# Patient Record
Sex: Male | Born: 1975
Health system: Southern US, Community
[De-identification: ages and names within clinical notes are randomized; demographics above are authoritative.]

## PROBLEM LIST (undated history)

## (undated) DIAGNOSIS — I10 Essential (primary) hypertension: Secondary | ICD-10-CM

## (undated) DIAGNOSIS — F32A Depression, unspecified: Secondary | ICD-10-CM

## (undated) DIAGNOSIS — S92909A Unspecified fracture of unspecified foot, initial encounter for closed fracture: Secondary | ICD-10-CM

## (undated) DIAGNOSIS — M722 Plantar fascial fibromatosis: Secondary | ICD-10-CM

## (undated) DIAGNOSIS — F329 Major depressive disorder, single episode, unspecified: Secondary | ICD-10-CM

## (undated) DIAGNOSIS — R0609 Other forms of dyspnea: Secondary | ICD-10-CM

## (undated) DIAGNOSIS — N289 Disorder of kidney and ureter, unspecified: Secondary | ICD-10-CM

## (undated) DIAGNOSIS — R0600 Dyspnea, unspecified: Secondary | ICD-10-CM

## (undated) DIAGNOSIS — F419 Anxiety disorder, unspecified: Secondary | ICD-10-CM

## (undated) DIAGNOSIS — I517 Cardiomegaly: Secondary | ICD-10-CM

## (undated) DIAGNOSIS — J189 Pneumonia, unspecified organism: Secondary | ICD-10-CM

## (undated) DIAGNOSIS — M1990 Unspecified osteoarthritis, unspecified site: Secondary | ICD-10-CM

## (undated) DIAGNOSIS — E785 Hyperlipidemia, unspecified: Secondary | ICD-10-CM

## (undated) DIAGNOSIS — Z992 Dependence on renal dialysis: Secondary | ICD-10-CM

## (undated) HISTORY — DX: Unspecified fracture of unspecified foot, initial encounter for closed fracture: S92.909A

## (undated) HISTORY — DX: Dyspnea, unspecified: R06.00

## (undated) HISTORY — PX: PYLOROPLASTY: SHX418

## (undated) HISTORY — DX: Disorder of kidney and ureter, unspecified: N28.9

## (undated) HISTORY — DX: Hyperlipidemia, unspecified: E78.5

## (undated) HISTORY — DX: Other forms of dyspnea: R06.09

## (undated) HISTORY — DX: Essential (primary) hypertension: I10

## (undated) HISTORY — PX: NO PAST SURGERIES: SHX2092

## (undated) HISTORY — PX: HERNIA REPAIR: SHX51

## (undated) HISTORY — DX: Plantar fascial fibromatosis: M72.2

## (undated) HISTORY — DX: Cardiomegaly: I51.7

---
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
## 2009-04-28 ENCOUNTER — Encounter: Payer: Self-pay | Admitting: Orthopedic Surgery

## 2009-04-28 ENCOUNTER — Emergency Department (HOSPITAL_COMMUNITY): Admission: EM | Admit: 2009-04-28 | Discharge: 2009-04-28 | Payer: Self-pay | Admitting: Emergency Medicine

## 2009-05-02 ENCOUNTER — Encounter: Payer: Self-pay | Admitting: Orthopedic Surgery

## 2009-05-03 ENCOUNTER — Ambulatory Visit: Payer: Self-pay | Admitting: Orthopedic Surgery

## 2009-05-03 DIAGNOSIS — S92909A Unspecified fracture of unspecified foot, initial encounter for closed fracture: Secondary | ICD-10-CM | POA: Insufficient documentation

## 2009-05-04 ENCOUNTER — Encounter: Payer: Self-pay | Admitting: Orthopedic Surgery

## 2009-06-01 ENCOUNTER — Encounter: Payer: Self-pay | Admitting: Cardiology

## 2009-06-15 ENCOUNTER — Ambulatory Visit: Payer: Self-pay | Admitting: Orthopedic Surgery

## 2009-06-15 DIAGNOSIS — R0989 Other specified symptoms and signs involving the circulatory and respiratory systems: Secondary | ICD-10-CM | POA: Insufficient documentation

## 2009-06-15 DIAGNOSIS — I10 Essential (primary) hypertension: Secondary | ICD-10-CM | POA: Insufficient documentation

## 2009-06-15 DIAGNOSIS — I517 Cardiomegaly: Secondary | ICD-10-CM | POA: Insufficient documentation

## 2009-06-15 DIAGNOSIS — M722 Plantar fascial fibromatosis: Secondary | ICD-10-CM | POA: Insufficient documentation

## 2009-06-15 DIAGNOSIS — R0609 Other forms of dyspnea: Secondary | ICD-10-CM | POA: Insufficient documentation

## 2009-06-15 DIAGNOSIS — R0600 Dyspnea, unspecified: Secondary | ICD-10-CM | POA: Insufficient documentation

## 2009-06-16 ENCOUNTER — Ambulatory Visit: Payer: Self-pay | Admitting: Cardiology

## 2009-06-23 ENCOUNTER — Encounter: Payer: Self-pay | Admitting: Cardiology

## 2009-06-23 ENCOUNTER — Ambulatory Visit: Payer: Self-pay | Admitting: Cardiology

## 2009-06-23 ENCOUNTER — Ambulatory Visit (HOSPITAL_COMMUNITY): Admission: RE | Admit: 2009-06-23 | Discharge: 2009-06-23 | Payer: Self-pay | Admitting: Cardiology

## 2009-07-06 ENCOUNTER — Encounter (INDEPENDENT_AMBULATORY_CARE_PROVIDER_SITE_OTHER): Payer: Self-pay | Admitting: *Deleted

## 2009-07-31 ENCOUNTER — Encounter (INDEPENDENT_AMBULATORY_CARE_PROVIDER_SITE_OTHER): Payer: Self-pay | Admitting: *Deleted

## 2009-08-22 ENCOUNTER — Ambulatory Visit: Payer: Self-pay | Admitting: Orthopedic Surgery

## 2010-04-18 ENCOUNTER — Encounter (INDEPENDENT_AMBULATORY_CARE_PROVIDER_SITE_OTHER): Payer: Self-pay | Admitting: *Deleted

## 2010-09-12 ENCOUNTER — Encounter (INDEPENDENT_AMBULATORY_CARE_PROVIDER_SITE_OTHER): Payer: Self-pay | Admitting: *Deleted

## 2010-11-21 ENCOUNTER — Emergency Department (HOSPITAL_COMMUNITY)
Admission: EM | Admit: 2010-11-21 | Discharge: 2010-11-21 | Payer: Self-pay | Source: Home / Self Care | Admitting: Emergency Medicine

## 2011-01-08 NOTE — Letter (Signed)
Summary: Appointment - Reminder 2  [facility] [place]. [address] [place] [address]   Phone: [phone]  Fax: [phone]     September 12, 2010 MRN: [id number]   Isaiah Ramos [place], [place]  [address]   Dear [name],  Our records indicate that it is time to schedule a follow-up appointment.  Dr. Verl Blalock         recommended that you follow up with [place] in    [place]        . It is very important that we reach you to schedule this appointment. We look forward to participating in your health care needs. Please contact us at the number listed above at your earliest convenience to schedule your appointment.  If you are unable to make an appointment at this time, give [place] a call so we can update our records.     Sincerely,   Public relations account executive

## 2011-01-08 NOTE — Letter (Signed)
Summary: Appointment - Reminder 2  [facility] [place]. [address] [place] [address]   Phone: [phone]  Fax: [phone]     Apr 18, 2010 MRN: [id number]   SONNIE ARRINDE[place], [place]  [address]   Dear Mr. Rettig,  Our records indicate that it is time to schedule a follow-up appointment.  Dr.  Verl Blalock        recommended that you follow up with [place] in    JANUARY 2011        . It is very important that we reach you to schedule this appointment. We look forward to participating in your health care needs. Please contact us at the number listed above at your earliest convenience to schedule your appointment.  If you are unable to make an appointment at this time, give [place] a call so we can update our records.     Sincerely,   [facility] Scheduling Team [phone]

## 2011-01-30 IMAGING — CR DG ANKLE COMPLETE 3+V*R*
3 series · 3 of 3 positions shown · non-contrast
Comparison: None

CLINICAL DATA: Injured ankle playing basketball.

RIGHT ANKLE - COMPLETE 3+ VIEW

[view not recorded (1 of 3)]
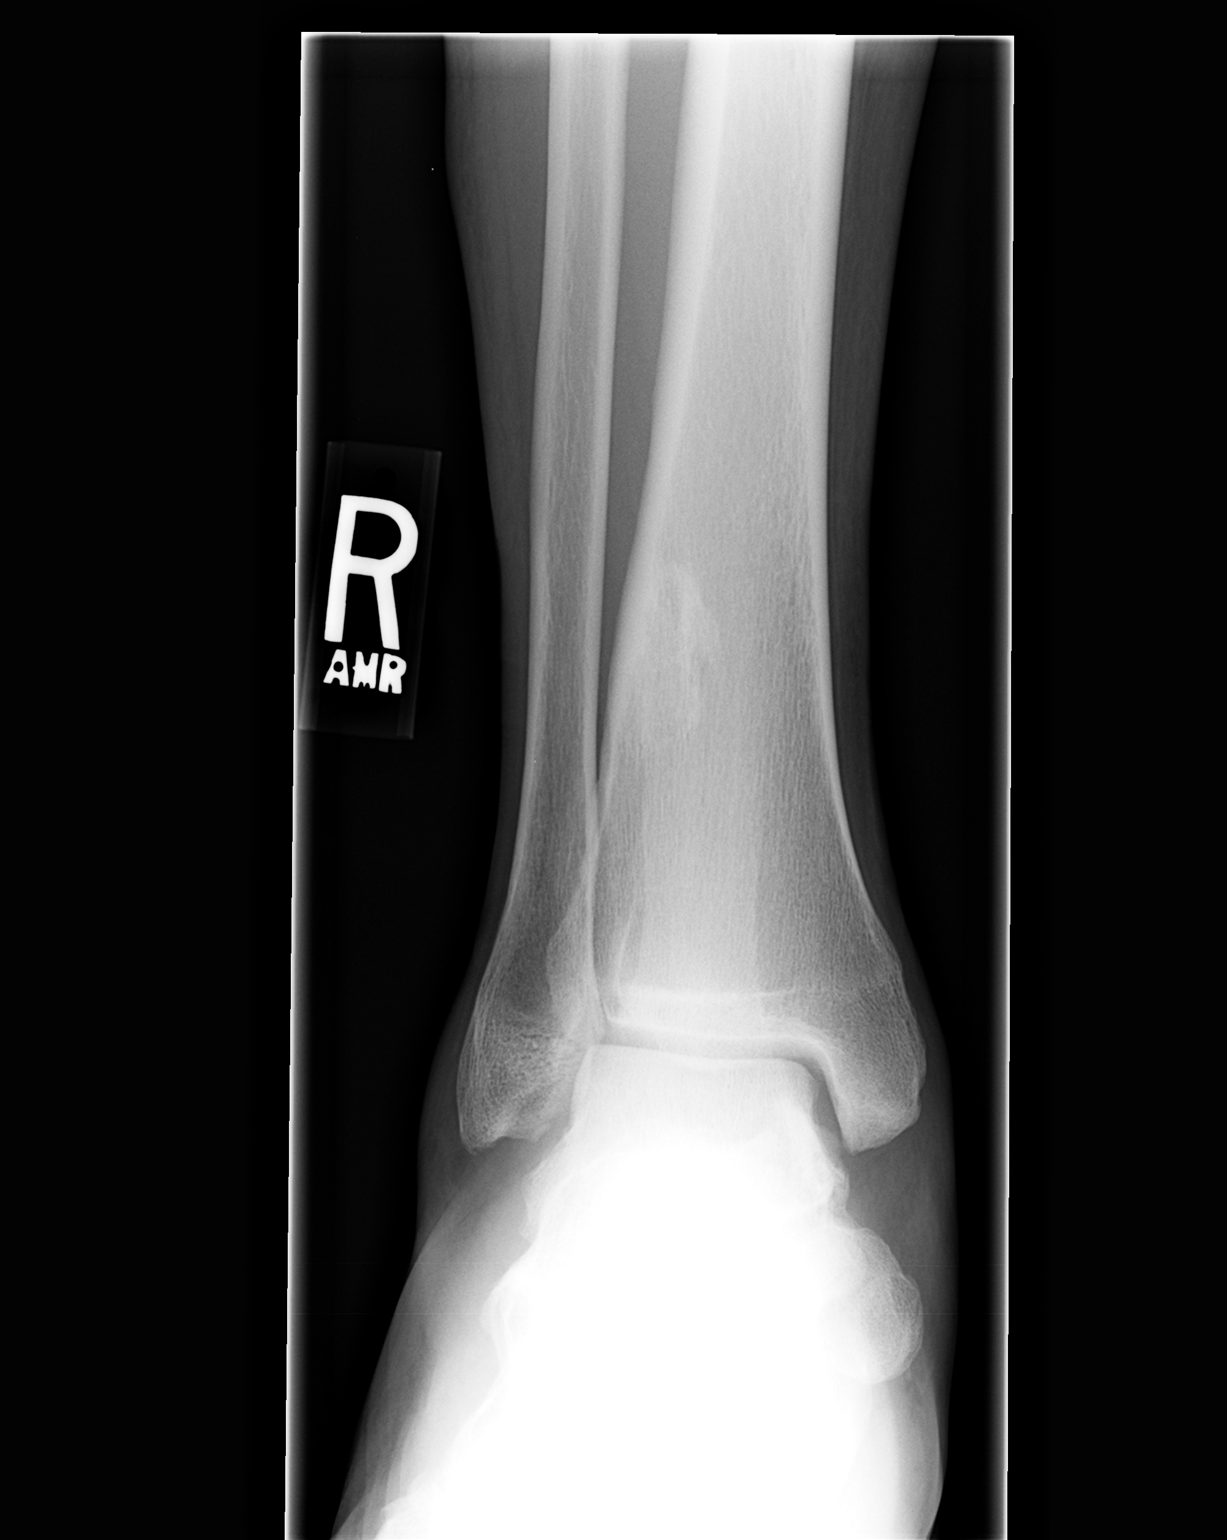

[view not recorded (2 of 3)]
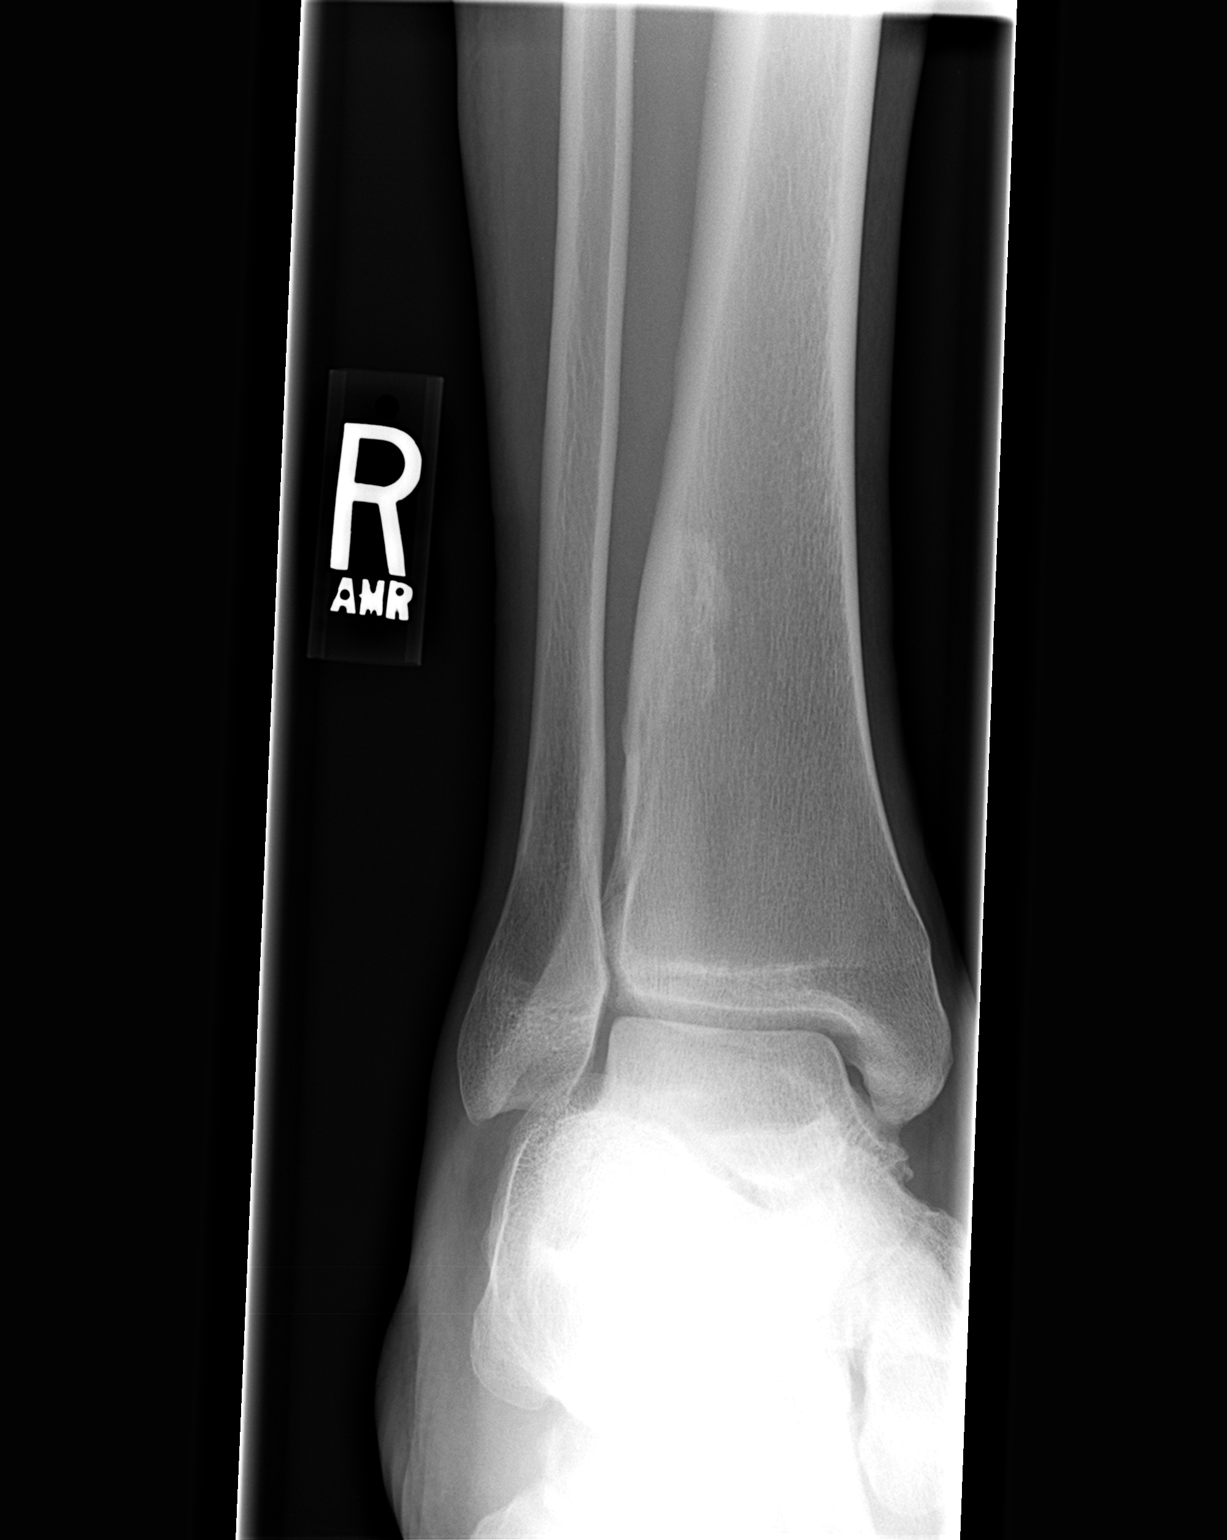

[view not recorded (3 of 3)]
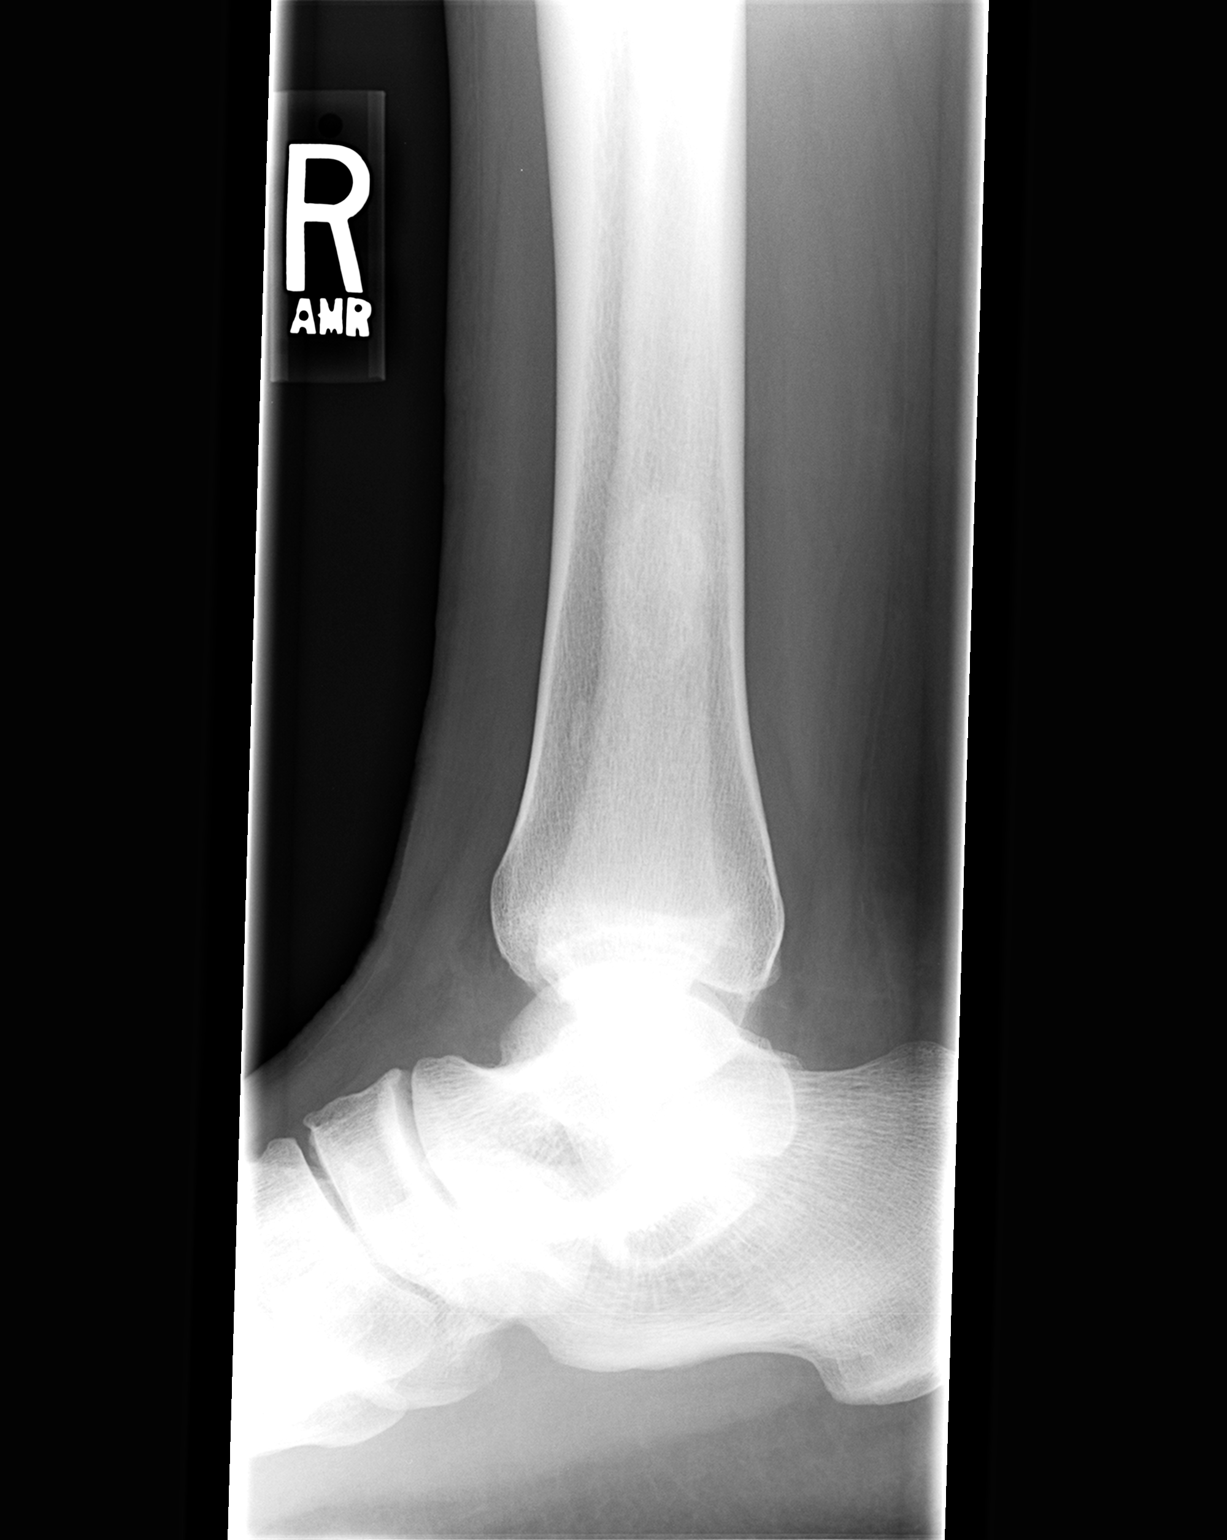

[3 of 3 positions shown; findings below may reference images not displayed]

FINDINGS: The ankle mortise is maintained.  No definite fractures
are seen.  No osteochondral lesions.  There is a healed or healing
fibrous cortical lesion involving the lateral aspect of the
metadiaphyseal region of the tibia.
IMPRESSION: No acute bony findings.

## 2011-04-23 NOTE — Assessment & Plan Note (Signed)
[facility] OFFICE NOTE   MCKINNON, GUMMO                        MRN:          [id number]  DATE:06/16/2009                            DOB:          07/23/1976    I was asked by Dr. Kerin Perna to consult on Aston Lempke with chest  discomfort and a history of cardiomegaly.   Isaiah Ramos is a 35 year old black male from [place], who has been  in this area for about 4 years.   He played a lot of basketball growing up and was told about 10 years ago  that his heart was enlarged.  At that time, he had a stress test that he  was told was negative.   He has recently been having some tingling sensations in the center of  his chest with some shooting pain down his left arm.  It is not made  worse with exertion.  It happens at rest and spontaneously.  It is not  positional related as well.   When he plays basketball, he has no significant limitations and has had  no history of exertional syncope or chest discomfort.   He denies any orthopnea, PND, or peripheral edema.   His history is significant for recent diagnosis of hypertension.  He  says his blood pressure is around 170 over about 100 when he was at the  [facility].  He was started on metoprolol tartrate 100 mg per day and  lisinopril 10 mg per day.  Laboratory data showed a creatinine of 2.22.  The rest of his labs were normal except for a low HDL of 36, LDL 72, and  a total cholesterol 140.   He is not diabetic.   PAST MEDICAL HISTORY:   ALLERGIES:  He has no known drug allergies.   MEDICATIONS:  He is currently on,  1. Metoprolol tartrate 100 mg p.o. daily.  2. Lisinopril 10 mg per day.  3. Fish oil 4 capsules a day.   He smokes about a pack of cigarettes in 2 days.  He does not drink or  use drugs.  He has had no surgeries.   SOCIAL HISTORY:  He is unemployed.  He has 4 children.  He enjoys music.  He occasionally plays basketball  still.   FAMILY HISTORY:  He has a history of diabetes and hypertension in his  father.  There is a history of one brother who died of sarcoidosis at  age 69.   REVIEW OF SYSTEMS:  He wears reading glasses.  He has occasional  dizziness, but no headaches.  He has had no syncope.  He states in his  review of systems that he occasionally gets short of breath lying down,  but I did not get that history from him.  He does have four tattoos.  The rest of review of systems are negative.   PHYSICAL EXAMINATION:  VITAL SIGNS:  He is 6 feet 5 inches, weighs 234,  his blood pressure is 106/69 in the right arm, 111/68 in the left arm,  his pulse is 58 and regular.  GENERAL:  Very pleasant, alert and oriented x3, seems to have a type B  or laid back personality.  His electrocardiogram is normal except for sinus brady and maybe some  early repol, particularly in the anterior precordial and lateral leads.  He has T-wave inversion in aVL, which is the same as it was in the [facility].  HEENT:  Otherwise unremarkable.  Dentition is satisfactory.  NECK:  Supple.  Carotid upstrokes are equal bilaterally without bruits.  There is no JVD.  Thyroid is not enlarged.  Trachea is midline.  CHEST/LUNGS:  Clear to auscultation and percussion.  HEART:  Nondisplaced PMI.  Normal S1 and S2.  No gallop.  ABDOMEN:  Soft, good bowel sounds.  No midline bruit.  No hepatomegaly.  EXTREMITIES:  No cyanosis, clubbing, or edema.  Pulses are intact.  NEUROLOGIC:  Intact.   ASSESSMENT:  1. Noncoronary chest pain.  2. Early repolarization.  3. Unknown duration of hypertension recently diagnosed at the [facility] and now on therapy with good control.  His creatinine of 2.2      attested this chronicity.  4. Tobacco use.  5. Mixed hyperlipidemia.   I had a long talk with Mr. Jude today.  I have recommend a 2D  echocardiogram to assess his LV function not to mention what degree of  LVH he might have at this  point.  Hopefully, this will regress with  hypertensive therapy as I have outlined with him today.  I have told him  to never come off his blood pressure medicines.   In addition, I have encouraged him to stop smoking.   PLAN:  1. A 2D echocardiogram.  2. See [place] back in 6 months.   I have made no changes in his medical program today.     Thomas C. Verl Blalock, MD, [facility]  Electronically Signed    TCW/MedQ  DD: 06/16/2009  DT: 06/17/2009  Job #: [id number]   cc:   Sherrilee Gilles. Gerarda Fraction, MD

## 2011-11-20 ENCOUNTER — Encounter: Payer: Self-pay | Admitting: Cardiology

## 2012-01-20 ENCOUNTER — Emergency Department (HOSPITAL_COMMUNITY)
Admission: EM | Admit: 2012-01-20 | Discharge: 2012-01-20 | Disposition: A | Discharge status: Home / Self Care | Payer: Self-pay | Attending: Emergency Medicine | Admitting: Emergency Medicine

## 2012-01-20 ENCOUNTER — Emergency Department (HOSPITAL_COMMUNITY): Payer: Self-pay

## 2012-01-20 ENCOUNTER — Encounter (HOSPITAL_COMMUNITY): Payer: Self-pay

## 2012-01-20 DIAGNOSIS — N289 Disorder of kidney and ureter, unspecified: Secondary | ICD-10-CM | POA: Insufficient documentation

## 2012-01-20 DIAGNOSIS — I517 Cardiomegaly: Secondary | ICD-10-CM | POA: Insufficient documentation

## 2012-01-20 DIAGNOSIS — R109 Unspecified abdominal pain: Secondary | ICD-10-CM | POA: Insufficient documentation

## 2012-01-20 DIAGNOSIS — I10 Essential (primary) hypertension: Secondary | ICD-10-CM | POA: Insufficient documentation

## 2012-01-20 LAB — COMPREHENSIVE METABOLIC PANEL
ALT: 19 U/L (ref 0–53)
AST: 19 U/L (ref 0–37)
Alkaline Phosphatase: 84 U/L (ref 39–117)
CO2: 25 mEq/L (ref 19–32)
Calcium: 9.5 mg/dL (ref 8.4–10.5)
GFR calc Af Amer: 33 mL/min — ABNORMAL LOW (ref >90)
Glucose, Bld: 129 mg/dL — ABNORMAL HIGH (ref 70–99)
Potassium: 4.3 mEq/L (ref 3.5–5.1)
Sodium: 140 mEq/L (ref 135–145)
Total Protein: 7.4 g/dL (ref 6.0–8.3)

## 2012-01-20 LAB — CBC
Platelets: 242 10*3/uL (ref 150–400)
RBC: 5.63 MIL/uL (ref 4.22–5.81)
RDW: 13.6 % (ref 11.5–15.5)
WBC: 9.5 10*3/uL (ref 4.0–10.5)

## 2012-01-20 LAB — DIFFERENTIAL
Basophils Absolute: 0 10*3/uL (ref 0.0–0.1)
Lymphocytes Relative: 31 % (ref 12–46)
Lymphs Abs: 2.9 10*3/uL (ref 0.7–4.0)
Neutrophils Relative %: 61 % (ref 43–77)

## 2012-01-20 LAB — URINALYSIS, ROUTINE W REFLEX MICROSCOPIC
Leukocytes, UA: NEGATIVE
Nitrite: NEGATIVE
Protein, ur: >300 mg/dL — AB
Specific Gravity, Urine: 1.03 (ref 1.005–1.030)
Urobilinogen, UA: 0.2 mg/dL (ref 0.0–1.0)

## 2012-01-20 MED ORDER — ONDANSETRON HCL 4 MG/2ML IJ SOLN
4.0000 mg | Freq: Once | INTRAMUSCULAR | Status: AC
  Administered 2012-01-20: 4 mg via INTRAVENOUS
  Filled 2012-01-20: qty 2

## 2012-01-20 MED ORDER — AMLODIPINE BESYLATE 5 MG PO TABS
5.0000 mg | ORAL_TABLET | Freq: Once | ORAL | Status: AC
  Administered 2012-01-20: 5 mg via ORAL
  Filled 2012-01-20: qty 1

## 2012-01-20 MED ORDER — BISOPROLOL-HYDROCHLOROTHIAZIDE 5-6.25 MG PO TABS: 1.0000 | ORAL_TABLET | Freq: Every day | ORAL | Status: DC

## 2012-01-20 MED ORDER — HYDROMORPHONE HCL PF 1 MG/ML IJ SOLN
1.0000 mg | Freq: Once | INTRAMUSCULAR | Status: AC
  Administered 2012-01-20: 1 mg via INTRAVENOUS
  Filled 2012-01-20: qty 1

## 2012-01-20 MED ORDER — SODIUM CHLORIDE 0.9 % IV SOLN
999.0000 mL | INTRAVENOUS | Status: DC
  Administered 2012-01-20: 999 mL via INTRAVENOUS

## 2012-01-20 MED ORDER — HYDROCODONE-ACETAMINOPHEN 5-325 MG PO TABS: 2.0000 | ORAL_TABLET | ORAL | Status: AC | PRN

## 2012-01-20 NOTE — ED Provider Notes (Signed)
History   This chart was scribed for Kathalene Frames, MD by Carolyne Littles. The patient was seen in room APA04/APA04 and the patient's care was started at 0942.   CSN: [id number]  Arrival date & time 01/20/12  [id number]   First MD Initiated Contact with Patient 01/20/12 1114      Chief Complaint  Patient presents with  . Flank Pain    (Consider location/radiation/quality/duration/timing/severity/associated sxs/prior treatment) HPI Isaiah Ramos is a 36 y.o. male who presents to the Emergency Department complaining of moderate waxing and waning right sided flank pain onset 2 weeks ago and persistent since with no associated symptoms. Reports flank pain is aggravated by lying down. Denies hematuria, dysuria, nausea, vomiting, diarrhea.Patient with h/o kidney disease, HTN, cardiomegaly.  Past Medical History  Diagnosis Date  . Kidney disease     Due to HTN  . Hypertension   . Dyspnea on exertion   . Hypertension   . Cardiomegaly   . Plantar fasciitis   . Fracture, foot     History reviewed. No pertinent past surgical history.  Family History  Problem Relation Age of Onset  . Hypertension Mother   . Hypertension Father   . Diabetes Father   . Sarcoidosis Sister     History  Substance Use Topics  . Smoking status: Smoker, Current Status Unknown  . Smokeless tobacco: Not on file  . Alcohol Use: Yes      Review of Systems 10 Systems reviewed and are negative for acute change except as noted in the HPI.  Allergies  Review of patient's allergies indicates no known allergies.  Home Medications   No current outpatient prescriptions on file.  BP 200/137  Pulse 78  Temp(Src) 98.9 F (37.2 C) (Oral)  Resp 16  Ht 6\' 5"  (1.956 m)  Wt 230 lb (104.327 kg)  BMI 27.27 kg/m2  SpO2 99%  Physical Exam  Nursing note and vitals reviewed. Constitutional: He appears well-developed and well-nourished. No distress.  HENT:  Head: Normocephalic and atraumatic.  Right Ear: External  ear normal.  Left Ear: External ear normal.  Eyes: Conjunctivae are normal. Right eye exhibits no discharge. Left eye exhibits no discharge. No scleral icterus.  Neck: Neck supple. No tracheal deviation present.  Cardiovascular: Normal rate, regular rhythm and intact distal pulses.   Pulmonary/Chest: Effort normal and breath sounds normal. No stridor. No respiratory distress. He has no wheezes. He has no rales.  Abdominal: Soft. Bowel sounds are normal. He exhibits no distension. There is no tenderness. There is CVA tenderness (mild, right). There is no rebound and no guarding.  Musculoskeletal: He exhibits no edema and no tenderness.  Neurological: He is alert. He has normal strength. No sensory deficit. Cranial nerve deficit:  no gross defecits noted. He exhibits normal muscle tone. He displays no seizure activity. Coordination normal.  Skin: Skin is warm and dry. No rash noted.  Psychiatric: He has a normal mood and affect.    ED Course  Procedures (including critical care time)  DIAGNOSTIC STUDIES: Oxygen Saturation is 99% on room air, normal by my interpretation.    COORDINATION OF CARE: 11:24AM- Patient informed of current plan for treatment and evaluation and agrees with plan at this time.    Medications  HYDROcodone-acetaminophen (NORCO) 5-325 MG per tablet (not administered)  bisoprolol-hydrochlorothiazide (ZIAC) 5-6.25 MG per tablet (not administered)  bisoprolol-hydrochlorothiazide (ZIAC) 5-6.25 MG per tablet (not administered)  HYDROmorphone (DILAUDID) injection 1 mg (1 mg Intravenous Given 01/20/12 1158)  ondansetron (ZOFRAN)  injection 4 mg (4 mg Intravenous Given 01/20/12 1156)  amLODipine (NORVASC) tablet 5 mg (5 mg Oral Given 01/20/12 1312)     Labs Reviewed  URINALYSIS, ROUTINE W REFLEX MICROSCOPIC - Abnormal; Notable for the following:    Hgb urine dipstick MODERATE (*)    Protein, ur >300 (*)    All other components within normal limits  COMPREHENSIVE METABOLIC  PANEL - Abnormal; Notable for the following:    Glucose, Bld 129 (*)    Creatinine, Ser 2.71 (*)    Total Bilirubin 0.2 (*)    GFR calc non Af Amer 29 (*)    GFR calc Af Amer 33 (*)    All other components within normal limits  LIPASE, BLOOD - Abnormal; Notable for the following:    Lipase 81 (*)    All other components within normal limits  CBC  DIFFERENTIAL  URINE MICROSCOPIC-ADD ON   Ct Abdomen Pelvis Wo Contrast  01/20/2012  *RADIOLOGY REPORT*  Clinical Data: Right sided flank pain, microscopic hematuria  CT ABDOMEN AND PELVIS WITHOUT CONTRAST  Technique:  Multidetector CT imaging of the abdomen and pelvis was performed following the standard protocol without intravenous contrast.  Comparison: None.  Findings: Lung bases are clear.  Probable ingested pill fragment incidentally noted within the otherwise normal appearing stomach.  1 mm probable nonobstructing right mid renal calculus identified on image 29.  Unenhanced liver, gallbladder, adrenal glands, kidneys, pancreas, and spleen are normal in appearance.  No lymphadenopathy or ascites.  No hydroureteronephrosis.  No radiopaque renal or ureteral calculus is otherwise identified.  The appendix, bladder, colon, and small bowel are normal.  Trace if any right pelvic fluid identified image 75, but could be artifactual in the absence of any other abnormality.  No acute osseous finding.  IMPRESSION: No acute intra-abdominal or pelvic pathology. Specifically, no radiopaque ureteral or bladder calculus is identified and there is no hydroureteronephrosis.  Possible trace right pelvic fluid versus artifact from volume averaging.  Original Report Authenticated By: Arline Asp, M.D.   Dg Abd Acute W/chest  01/20/2012  *RADIOLOGY REPORT*  Clinical Data: Low back pain, left flank pain.  ACUTE ABDOMEN SERIES (ABDOMEN 2 VIEW & CHEST 1 VIEW)  Comparison: None.  Findings: There is normal bowel gas pattern.  No free air.  No organomegaly or suspicious  calcification.  No acute bony abnormality.  Lungs are clear.  Heart is normal size.  No effusions.  IMPRESSION: No acute findings.  Original Report Authenticated By: Raelyn Number, M.D.     1. HYPERTENSION   2. Renal insufficiency       MDM  Pt without signs of kidney stone.  Doubt biliary colic.  Pt has history of HTN and has not been taking medications for a prolonged period of time.  Explained to patient that he has evidence of renal insufficiency due to his HTN.  Pt is asymptomatic.  Doubt HTN emergency.  Pt has been given oral anti HTN agents and he has been given a prescription for chlorthalidone-bisoprolol.  I have explained the importance of seeing a PCP and he has been given a resource sheet.      I personally performed the services described in this documentation, which was scribed in my presence.  The recorded information has been reviewed and considered.    Kathalene Frames, MD 01/21/12 [phone]

## 2012-01-20 NOTE — ED Notes (Signed)
C/o rt flank pain x 2 weeks with increase in urination.

## 2012-11-13 ENCOUNTER — Emergency Department (HOSPITAL_COMMUNITY): Payer: Self-pay

## 2012-11-13 ENCOUNTER — Emergency Department (HOSPITAL_COMMUNITY)
Admission: EM | Admit: 2012-11-13 | Discharge: 2012-11-13 | Disposition: A | Discharge status: Home / Self Care | Payer: Self-pay | Attending: Emergency Medicine | Admitting: Emergency Medicine

## 2012-11-13 ENCOUNTER — Encounter (HOSPITAL_COMMUNITY): Payer: Self-pay | Admitting: *Deleted

## 2012-11-13 DIAGNOSIS — I129 Hypertensive chronic kidney disease with stage 1 through stage 4 chronic kidney disease, or unspecified chronic kidney disease: Secondary | ICD-10-CM | POA: Insufficient documentation

## 2012-11-13 DIAGNOSIS — Z8781 Personal history of (healed) traumatic fracture: Secondary | ICD-10-CM | POA: Insufficient documentation

## 2012-11-13 DIAGNOSIS — H539 Unspecified visual disturbance: Secondary | ICD-10-CM | POA: Insufficient documentation

## 2012-11-13 DIAGNOSIS — Z8739 Personal history of other diseases of the musculoskeletal system and connective tissue: Secondary | ICD-10-CM | POA: Insufficient documentation

## 2012-11-13 DIAGNOSIS — R110 Nausea: Secondary | ICD-10-CM | POA: Insufficient documentation

## 2012-11-13 DIAGNOSIS — R42 Dizziness and giddiness: Secondary | ICD-10-CM | POA: Insufficient documentation

## 2012-11-13 DIAGNOSIS — F17200 Nicotine dependence, unspecified, uncomplicated: Secondary | ICD-10-CM | POA: Insufficient documentation

## 2012-11-13 DIAGNOSIS — I10 Essential (primary) hypertension: Secondary | ICD-10-CM

## 2012-11-13 DIAGNOSIS — R51 Headache: Secondary | ICD-10-CM | POA: Insufficient documentation

## 2012-11-13 DIAGNOSIS — N289 Disorder of kidney and ureter, unspecified: Secondary | ICD-10-CM

## 2012-11-13 DIAGNOSIS — N189 Chronic kidney disease, unspecified: Secondary | ICD-10-CM | POA: Insufficient documentation

## 2012-11-13 DIAGNOSIS — H53149 Visual discomfort, unspecified: Secondary | ICD-10-CM | POA: Insufficient documentation

## 2012-11-13 DIAGNOSIS — Z8709 Personal history of other diseases of the respiratory system: Secondary | ICD-10-CM | POA: Insufficient documentation

## 2012-11-13 DIAGNOSIS — Z8679 Personal history of other diseases of the circulatory system: Secondary | ICD-10-CM | POA: Insufficient documentation

## 2012-11-13 DIAGNOSIS — J45909 Unspecified asthma, uncomplicated: Secondary | ICD-10-CM | POA: Insufficient documentation

## 2012-11-13 LAB — CBC WITH DIFFERENTIAL/PLATELET
Eosinophils Absolute: 0.2 10*3/uL (ref 0.0–0.7)
Eosinophils Relative: 2 % (ref 0–5)
HCT: 45 % (ref 39.0–52.0)
Lymphocytes Relative: 29 % (ref 12–46)
Lymphs Abs: 2.9 10*3/uL (ref 0.7–4.0)
MCH: 29.8 pg (ref 26.0–34.0)
MCV: 86.4 fL (ref 78.0–100.0)
Monocytes Absolute: 0.7 10*3/uL (ref 0.1–1.0)
Monocytes Relative: 7 % (ref 3–12)
Platelets: 263 10*3/uL (ref 150–400)
RBC: 5.21 MIL/uL (ref 4.22–5.81)
WBC: 10 10*3/uL (ref 4.0–10.5)

## 2012-11-13 LAB — URINALYSIS, ROUTINE W REFLEX MICROSCOPIC
Leukocytes, UA: NEGATIVE
Nitrite: NEGATIVE
Specific Gravity, Urine: 1.025 (ref 1.005–1.030)
pH: 6 (ref 5.0–8.0)

## 2012-11-13 LAB — URINE MICROSCOPIC-ADD ON

## 2012-11-13 LAB — BASIC METABOLIC PANEL
Chloride: 105 mEq/L (ref 96–112)
GFR calc Af Amer: 31 mL/min — ABNORMAL LOW (ref >90)
Potassium: 4.2 mEq/L (ref 3.5–5.1)
Sodium: 138 mEq/L (ref 135–145)

## 2012-11-13 MED ORDER — METOCLOPRAMIDE HCL 5 MG/ML IJ SOLN
10.0000 mg | Freq: Once | INTRAMUSCULAR | Status: AC
  Administered 2012-11-13: 10 mg via INTRAVENOUS
  Filled 2012-11-13: qty 2

## 2012-11-13 MED ORDER — BISOPROLOL-HYDROCHLOROTHIAZIDE 5-6.25 MG PO TABS: 1.0000 | ORAL_TABLET | Freq: Every day | ORAL | Status: DC

## 2012-11-13 MED ORDER — DIPHENHYDRAMINE HCL 50 MG/ML IJ SOLN
25.0000 mg | Freq: Once | INTRAMUSCULAR | Status: AC
  Administered 2012-11-13: 25 mg via INTRAVENOUS
  Filled 2012-11-13: qty 1

## 2012-11-13 MED ORDER — SODIUM CHLORIDE 0.9 % IV BOLUS (SEPSIS)
1000.0000 mL | Freq: Once | INTRAVENOUS | Status: AC
  Administered 2012-11-13: 1000 mL via INTRAVENOUS

## 2012-11-13 MED ORDER — METOCLOPRAMIDE HCL 10 MG PO TABS: 10.0000 mg | ORAL_TABLET | Freq: Four times a day (QID) | ORAL | Status: DC

## 2012-11-13 NOTE — ED Notes (Signed)
Headache, blurred vision at times, dizziness. Symptoms began a few weeks ago. Pt is supposed to be on BP med but does not take it.

## 2012-11-13 NOTE — Discharge Instructions (Signed)
Your blood pressure was very high today and was also very high the last time he was seen in the emergency department. It is very important that you control the blood pressure on it to prevent heart attacks, strokes, worsening kidney failure. You already have decreased kidney function because of your blood pressure. Please make arrangements to get a primary care provider to continue to prescribe your blood pressure medication and monitor you for signs of blood pressure affecting your body systems like your heart and kidneys. Please stop smoking, since smoking makes all the effects of high blood pressure significantly worse.  General Headache Without Cause A headache is pain or discomfort felt around the head or neck area. The specific cause of a headache may not be found. There are many causes and types of headaches. A few common ones are:  Tension headaches.  Migraine headaches.  Cluster headaches.  Chronic daily headaches. HOME CARE INSTRUCTIONS   Keep all follow-up appointments with your caregiver or any specialist referral.  Only take over-the-counter or prescription medicines for pain or discomfort as directed by your caregiver.  Lie down in a dark, quiet room when you have a headache.  Keep a headache journal to find out what may trigger your migraine headaches. For example, write down:  What you eat and drink.  How much sleep you get.  Any change to your diet or medicines.  Try massage or other relaxation techniques.  Put ice packs or heat on the head and neck. Use these 3 to 4 times per day for 15 to 20 minutes each time, or as needed.  Limit stress.  Sit up straight, and do not tense your muscles.  Quit smoking if you smoke.  Limit alcohol use.  Decrease the amount of caffeine you drink, or stop drinking caffeine.  Eat and sleep on a regular schedule.  Get 7 to 9 hours of sleep, or as recommended by your caregiver.  Keep lights dim if bright lights bother you and  make your headaches worse. SEEK MEDICAL CARE IF:   You have problems with the medicines you were prescribed.  Your medicines are not working.  You have a change from the usual headache.  You have nausea or vomiting. SEEK IMMEDIATE MEDICAL CARE IF:   Your headache becomes severe.  You have a fever.  You have a stiff neck.  You have loss of vision.  You have muscular weakness or loss of muscle control.  You start losing your balance or have trouble walking.  You feel faint or pass out.  You have severe symptoms that are different from your first symptoms. MAKE SURE YOU:   Understand these instructions.  Will watch your condition.  Will get help right away if you are not doing well or get worse. Document Released: 11/25/2005 Document Revised: 02/17/2012 Document Reviewed: 12/11/2011 [facility] Patient Information 2013 [place].   Hypertension As your heart beats, it forces blood through your arteries. This force is your blood pressure. If the pressure is too high, it is called hypertension (HTN) or high blood pressure. HTN is dangerous because you may have it and not know it. High blood pressure may mean that your heart has to work harder to pump blood. Your arteries may be narrow or stiff. The extra work puts you at risk for heart disease, stroke, and other problems.  Blood pressure consists of two numbers, a higher number over a lower, 110/72, for example. It is stated as "110 over 72." The ideal  is below 120 for the top number (systolic) and under 80 for the bottom (diastolic). Write down your blood pressure today. You should pay close attention to your blood pressure if you have certain conditions such as:  Heart failure.  Prior heart attack.  Diabetes  Chronic kidney disease.  Prior stroke.  Multiple risk factors for heart disease. To see if you have HTN, your blood pressure should be measured while you are seated with your arm held at the level of the  heart. It should be measured at least twice. A one-time elevated blood pressure reading (especially in the Emergency Department) does not mean that you need treatment. There may be conditions in which the blood pressure is different between your right and left arms. It is important to see your caregiver soon for a recheck. Most people have essential hypertension which means that there is not a specific cause. This type of high blood pressure may be lowered by changing lifestyle factors such as:  Stress.  Smoking.  Lack of exercise.  Excessive weight.  Drug/tobacco/alcohol use.  Eating less salt. Most people do not have symptoms from high blood pressure until it has caused damage to the body. Effective treatment can often prevent, delay or reduce that damage. TREATMENT  When a cause has been identified, treatment for high blood pressure is directed at the cause. There are a large number of medications to treat HTN. These fall into several categories, and your caregiver will help you select the medicines that are best for you. Medications may have side effects. You should review side effects with your caregiver. If your blood pressure stays high after you have made lifestyle changes or started on medicines,   Your medication(s) may need to be changed.  Other problems may need to be addressed.  Be certain you understand your prescriptions, and know how and when to take your medicine.  Be sure to follow up with your caregiver within the time frame advised (usually within two weeks) to have your blood pressure rechecked and to review your medications.  If you are taking more than one medicine to lower your blood pressure, make sure you know how and at what times they should be taken. Taking two medicines at the same time can result in blood pressure that is too low. SEEK IMMEDIATE MEDICAL CARE IF:  You develop a severe headache, blurred or changing vision, or confusion.  You have unusual  weakness or numbness, or a faint feeling.  You have severe chest or abdominal pain, vomiting, or breathing problems. MAKE SURE YOU:   Understand these instructions.  Will watch your condition.  Will get help right away if you are not doing well or get worse. Document Released: 11/25/2005 Document Revised: 02/17/2012 Document Reviewed: 07/15/2008 [facility] Patient Information 2013 [place].  Smoking Hazards Smoking cigarettes is extremely bad for your health. Tobacco smoke has over 200 known poisons in it. There are over 60 chemicals in tobacco smoke that cause cancer. Some of the chemicals found in cigarette smoke include:   Cyanide.  Benzene.  Formaldehyde.  Methanol (wood alcohol).  Acetylene (fuel used in welding torches).  Ammonia. Cigarette smoke also contains the poisonous gases nitrogen oxide and carbon monoxide.  Cigarette smokers have an increased risk of many serious medical problems, including:  Lung cancer.  Lung disease (such as pneumonia, bronchitis, and emphysema).  Heart attack and chest pain due to the heart not getting enough oxygen (angina).  Heart disease and peripheral blood vessel disease.  Hypertension.  Stroke.  Oral cancer (cancer of the lip, mouth, or voice box).  Bladder cancer.  Pancreatic cancer.  Cervical cancer.  Pregnancy complications, including premature birth.  Low birthweight babies.  Early menopause.  Lower estrogen level for women.  Infertility.  Facial wrinkles.  Blindness.  Increased risk of broken bones (fractures).  Senile dementia.  Stillbirths and smaller newborn babies, birth defects, and genetic damage to sperm.  Stomach ulcers and internal bleeding. Children of smokers have an increased risk of the following, because of secondhand smoke exposure:   Sudden infant death syndrome (SIDS).  Respiratory infections.  Lung cancer.  Heart disease.  Ear infections. Smoking causes  approximately:  90% of all lung cancer deaths in men.  80% of all lung cancer deaths in women.  90% of deaths from chronic obstructive lung disease. Compared with nonsmokers, smoking increases the risk of:  Coronary heart disease by 2 to 4 times.  Stroke by 2 to 4 times.  Men developing lung cancer by 23 times.  Women developing lung cancer by 13 times.  Dying from chronic obstructive lung diseases by 12 times. Someone who smokes 2 packs a day loses about 8 years of his or her life. Even smoking lightly shortens your life expectancy by several years. You can greatly reduce the risk of medical problems for you and your family by stopping now. Smoking is the most preventable cause of death and disease in our society. Within days of quitting smoking, your circulation returns to normal, you decrease the risk of having a heart attack, and your lung capacity improves. There may be some increased phlegm in the first few days after quitting, and it may take months for your lungs to clear up completely. Quitting for 10 years cuts your lung cancer risk to almost that of a nonsmoker. WHY IS SMOKING ADDICTIVE?  Nicotine is the chemical agent in tobacco that is capable of causing addiction or dependence.  When you smoke and inhale, nicotine is absorbed rapidly into the bloodstream through your lungs. Nicotine absorbed through the lungs is capable of creating a powerful addiction. Both inhaled and non-inhaled nicotine may be addictive.  Addiction studies of cigarettes and spit tobacco show that addiction to nicotine occurs mainly during the teen years, when young people begin using tobacco products. WHAT ARE THE BENEFITS OF QUITTING?  There are many health benefits to quitting smoking.   Likelihood of developing cancer and heart disease decreases. Health improvements are seen almost immediately.  Blood pressure, pulse rate, and breathing patterns start returning to normal soon after  quitting.  People who quit may see an improvement in their overall quality of life. Some people choose to quit all at once. Other options include nicotine replacement products, such as patches, gum, and nasal sprays. Do not use these products without first checking with your caregiver. QUITTING SMOKING It is not easy to quit smoking. Nicotine is addicting, and longtime habits are hard to change. To start, you can write down all your reasons for quitting, tell your family and friends you want to quit, and ask for their help. Throw your cigarettes away, chew gum or cinnamon sticks, keep your hands busy, and drink extra water or juice. Go for walks and practice deep breathing to relax. Think of all the money you are saving: around $1,000 a year, for the average pack-a-day smoker. Nicotine patches and gum have been shown to improve success at efforts to stop smoking. Zyban (bupropion) is an anti-depressant drug that can  be prescribed to reduce nicotine withdrawal symptoms and to suppress the urge to smoke. Smoking is an addiction with both physical and psychological effects. Joining a stop-smoking support group can help you cope with the emotional issues. For more information and advice on programs to stop smoking, call your doctor, your local hospital, or these organizations:  [place] - 1-800-ACS-2345 Document Released: 01/02/2005 Document Revised: 02/17/2012 Document Reviewed: 09/06/2009 [facility] Patient Information 2013 [place].  Bisoprolol; Hydrochlorothiazide, HCTZ tablets What is this medicine? BISOPROLOL; HYDROCHLOROTHIAZIDE (bis OH proe lol; hye droe klor oh THYE a zide) is a combination of a beta-blocker and a diuretic. It is used to treat high blood pressure. This medicine may be used for other purposes; ask your health care provider or pharmacist if you have questions. What should I tell my health care provider before I take  this medicine? They need to know if you have any of these conditions: -circulation problems, or blood vessel disease -decreased urine -diabetes -heart disease, heart failure or a history of heart attack -kidney disease -liver disease -lung or breathing disease, like asthma -slow heart rate -thyroid disease -an unusual or allergic reaction to hydrochlorothiazide, bisoprolol, sulfa drugs, other medicines, foods, dyes, or preservatives -pregnant or trying to get pregnant -breast-feeding How should I use this medicine? Take this medicine by mouth with a glass of water. Follow the directions on the prescription label. You can take it with or without food. If it upsets your stomach, take it with food. Take your medicine at regular intervals. Do not take it more often than directed. Do not stop taking except on your doctor's advice. Talk to your pediatrician regarding the use of this medicine in children. Special care may be needed. Overdosage: If you think you have taken too much of this medicine contact a poison control center or emergency room at once. NOTE: This medicine is only for you. Do not share this medicine with others. What if I miss a dose? If you miss a dose, take it as soon as you can. If it is almost time for your next dose, take only that dose. Do not take double or extra doses. What may interact with this medicine? -barbiturates, like phenobarbital -cholestyramine -colestipol -corticosteroids, like prednisone -lithium -medicines for chest pain or angina -medicines for diabetes -medicines for high blood pressure or heart failure -medicines to control heart rhythm -NSAIDs, medicines for pain and inflammation, like ibuprofen or naproxen -prescription pain medicines -rifampin -skeletal muscle relaxants like tubocurarine This list may not describe all possible interactions. Give your health care provider a list of all the medicines, herbs, non-prescription drugs, or dietary  supplements you use. Also tell them if you smoke, drink alcohol, or use illegal drugs. Some items may interact with your medicine. What should I watch for while using this medicine? Visit your doctor or health care professional for regular checks on your progress. Check your blood pressure as directed. Ask your doctor or health care professional what your blood pressure should be and when you should contact him or her. Check with your doctor or health care professional if you get an attack of severe diarrhea, nausea and vomiting, or if you sweat a lot. The loss of too much body fluid can make it dangerous for you to take this medicine. You may get drowsy or dizzy. Do not drive, use machinery, or do anything that needs mental alertness until you know how this drug affects you. Do not stand  or sit up quickly, especially if you are an older patient. This reduces the risk of dizzy or fainting spells. Alcohol can make you more drowsy and dizzy. Avoid alcoholic drinks. This medicine may affect your blood sugar level. If you have diabetes, check with your doctor or health care professional before changing the dose of your diabetic medicine. This medicine can make you more sensitive to the sun. Keep out of the sun. If you cannot avoid being in the sun, wear protective clothing and use sunscreen. Do not use sun lamps or tanning beds/booths. Do not treat yourself for coughs, colds, or pain while you are taking this medicine without asking your doctor or health care professional for advice. Some ingredients may increase your blood pressure. What side effects may I notice from receiving this medicine? Side effects that you should report to your doctor or health care professional as soon as possible: -allergic reactions like skin rash, itching or hives, swelling of the face, lips, or tongue -breathing problems -changes in vision -chest pain -cold, tingling, or numb hands or feet -eye pain -fast, irregular, or  slow heartbeat -increased thirst or sweating -muscle cramps -redness, blistering, peeling or loosening of the skin, including inside the mouth -swollen legs or ankles -tremors -unusual bruising -unusual weak or tired -vomiting -worsened gout pain -yellowing of the eyes or skin Side effects that usually do not require medical attention (report to your doctor or health care professional if they continue or are bothersome): -change in sex drive or performance -cough -depression -diarrhea -nausea This list may not describe all possible side effects. Call your doctor for medical advice about side effects. You may report side effects to FDA at 1-800-FDA-1088. Where should I keep my medicine? Keep out of the reach of children. Store at room temperature between 15 and 30 degrees C (59 and 86 degrees F). Keep container tightly closed. Throw away any unused medicine after the expiration date. NOTE: This sheet is a summary. It may not cover all possible information. If you have questions about this medicine, talk to your doctor, pharmacist, or health care provider.  2012, Elsevier/Gold Standard. (08/15/2010 12:53:54 PM)  RESOURCE GUIDE  Chronic Pain Problems: Contact [facility]  [phone] Patients need to be referred by their primary care doctor.  Insufficient Money for Medicine: Contact [employer]:  call "211."   No Primary Care Doctor: - Call Health Connect  [phone] - can help you locate a primary care doctor that  accepts your insurance, provides certain services, etc. - Physician Referral Service- [phone]  Agencies that provide inexpensive medical care: - [facility]  [facility]  [phone] - [facility]  [phone] - [facility]  [phone] - [facility]  [facility]  [phone]  [place] Providers: - [facility]- [address]  [phone], Mon-Fri 9am-7pm, Sat 9am-1pm - [facility]- [place], [place], [address] [place]  [phone] [facility]- [address]  [place], [address], [phone]  Only accepts [place] patients after they have their name  applied to their card  Self Pay (no insurance) in [place]: - Sickle Cell Patients: Dr Kevan Ny, [facility]  [place], [facility]- [phone]  [facility] [place]- [id number] [place], [facility]- see information above (Speak to [employer] if you do not have insurance)       -  [employer]- [place],  [place], [place]  Dr Vista Lawman-  [address], [place], [place], [place]       -  Urgent Medical and [place], [phone]       -  [facility] [place]- [address], [place], also [address], [phone]       -    [facility]- [address], [place], 1st & 3rd Saturday        every month, 10am-1pm  1) Find a Doctor and Pay Out of Pocket Although you won't have to find out who is covered by your insurance plan, it is a good idea to ask around and get recommendations. You will then need to call the office and see if the doctor you have chosen will accept you as a new patient and what types of options they offer for patients who are self-pay. Some doctors offer discounts or will set up payment plans for their patients who do not have insurance, but you will need to ask so you aren't surprised when you get to your appointment.  2) Contact Your Local Health Department Not all health departments have doctors that can see  patients for sick visits, but many do, so it is worth a call to see if yours does. If you don't know where your local health department is, you can check in your phone book. The CDC also has a tool to help you locate your state's health department, and many state websites also have listings of all of their local health departments.  3) Find a [facility] If your illness is not likely to be very severe or complicated, you may want to try a walk in clinic. These are popping up all over the country in pharmacies, drugstores, and shopping centers. They're usually staffed by nurse practitioners or physician assistants that have been trained to treat common illnesses and complaints. They're usually fairly quick and inexpensive. However, if you have serious medical issues or chronic medical problems, these are probably not your best option  STD Testing - [place], [facility], [address] [place], phone [phone] or [phone].  Monday - Friday, call for an appointment. - [place], [facility] Clinic, [address], [place], phone [phone] or [phone].  Monday - Friday, call for an appointment.  Abuse/Neglect: - [place] [phone] - [place] [phone] (After Hours)  Emergency Shelter:  [facility] [phone]  Maternity Homes: - [facility] [phone] - [place] [phone]  MRSA Hotline #:   630-194-9363  [facility] [place] Dept. [address]  [place] Phone:  [phone]                                  Phone:  [phone]                    Phone:  [phone]  [facility], [place] in [place], [address]             [phone], [place] [phone] or [phone] (After Hours)  Dental Assistance  If unable to pay or uninsured, contact:  [facility]. to become qualified for the adult dental clinic.  Patients with Medicaid: [facility] [phone] [address], [place] [address] [phone]  If unable to pay, or uninsured, contact [facility] [phone] in [place], [place] in [facility]) to become qualified for the adult dental clinic  Other [place]- [place], [place], [place], [address], [place], [place], 2nd and 4th Thursday of the month at 6:30am.  10 clients each day by appointment, can sometimes see walk-in patients if someone does not show for an appointment. [facility]- [address] Hillard Danker [place], [place], [address], [place], [place], [place], [address], [place] Department- [place] Department- [place] in the [facility]  Intensive Outpatient Programs: [facility]      [place]. [place], [facility] Both a day and evening program       [facility]     [address]        [place], [place] [address] [phone]         ADS: Alcohol & Drug Svcs [place]: [phone] or [phone] [address], [place]  [address] [email or web address]  Behavioral Health Services  Substance Abuse Resources: - Alcohol and Drug Services  [phone] - [place] [phone] - [facility] [phone] Chinita Pester [phone] - Residential & Outpatient Substance Abuse Program  [phone]  Psychological Services: - [place]  [place]  [place], [place] [address], [place], [place]: [phone] or [phone], [email or web address]  Mobile Crisis Teams:                                        [facility]rapeutic Alternatives         Mobile  Crisis Care Unit [phone]             [facility] [address] [place] [address] [place] [phone]  Self-Help/Support Groups: [facility] [employer] of support groups [phone] (call for more info)   Narcotics Anonymous (NA) Caring Services [address] [place] - 2 meetings at this location  Residential Treatment Programs:  [place]       [place] [address], [address] [place], [place]  [address] [place]  [address] [place], [place] [address] [phone] Admissions: 8am-3pm M-F  Incentives Substance [place]     [address] [place], [place] [address]       [phone]         [facility] [place] [address] Jadene Pierini [place], [place]  [facility] [address] [place], [place]  [facility] Programs - Intensive Outpatient      [address] [place] [phone]     [place], [place]         [facility] ([place]     [place], [place] or [phone]  Residential Treatment Services (RTS), Medicaid [place], [place]  [facility] [address]                                               [place]  [facility] Resources: [phone]               General Therapy                                                Domenic Schwab, PhD        [address] [place], [place] [address]         [facility]   [address] [place] [place], [place] [address] [phone]  [facility] [address] [place], [place] [address] [phone] Insurance/Medicaid/sponsorship through [employer]                                              [place]  [place], [place] [address]    Therapy/tele-psych/case         [place], [place]  [address]  Adolescent/group home/case management [phone]                                           Rosette Reveal PhD       General therapy       Insurance   [phone]         Dr. Adele Schilder, Insurance, M-F [phone]

## 2012-11-13 NOTE — ED Notes (Addendum)
Headache for 2 days, with tingling in lt arm, dizzy.  Supposed to be on BP meds, none since 2011

## 2012-11-13 NOTE — ED Provider Notes (Signed)
History  This chart was scribed for Delora Fuel, MD by Roe Coombs, ED Scribe. The patient was seen in room APA12/APA12. Patient's care was started at 1509.   CSN: [id number]  Arrival date & time 11/13/12  1406   First MD Initiated Contact with Patient 11/13/12 1509      Chief Complaint  Patient presents with  . Headache     The history is provided by the patient. No language interpreter was used.    HPI Comments: Isaiah Ramos is a 36 y.o. male with a history of HTN and decreased kidney function who presents to the Emergency Department complaining of left-sided, severe, dull, constant HA onset a few weeks ago. Patient states that the pain is intermittent, but is gradually worsening in severity. Patient rates pain as 7/10. The pain is aggravated by light. There are no relieving factors. There is associated dizziness, lightheadedness, intermittent blurry vision, tingling in left arm and nausea. Patient denies epistaxis, tinnitus or vomiting. Patient says that he hasn't taken any OTC medications or prescription medications for pain relief. Patient states that he hasn't taken BP medication since 2011. Patient reports that his recent BP readings at home have been around 220/190. Patient is a current smoker and states that he smokes about 1 pack over 2 days.   Past Medical History  Diagnosis Date  . Hypertension   . Dyspnea on exertion   . Hypertension   . Cardiomegaly   . Plantar fasciitis   . Fracture, foot   . Kidney disease     Due to HTN    History reviewed. No pertinent past surgical history.  Family History  Problem Relation Age of Onset  . Hypertension Mother   . Hypertension Father   . Diabetes Father   . Sarcoidosis Sister     History  Substance Use Topics  . Smoking status: Smoker, Current Status Unknown  . Smokeless tobacco: Not on file  . Alcohol Use: Yes      Review of Systems  HENT: Negative for nosebleeds and tinnitus.   Eyes: Positive for photophobia  and visual disturbance.  Gastrointestinal: Positive for nausea. Negative for vomiting.  Neurological: Positive for dizziness, light-headedness and headaches.  All other systems reviewed and are negative.    Allergies  Review of patient's allergies indicates no known allergies.  Home Medications   Current Outpatient Rx  Name  Route  Sig  Dispense  Refill  . BISOPROLOL-HYDROCHLOROTHIAZIDE 5-6.25 MG PO TABS   Oral   Take 1 tablet by mouth daily.   90 tablet   1     Triage Vitals: BP 194/121  Pulse 80  Temp 97.9 F (36.6 C) (Oral)  Resp 20  Ht 6\' 5"  (1.956 m)  Wt 250 lb (113.399 kg)  BMI 29.65 kg/m2  SpO2 100%  Physical Exam  Constitutional: He is oriented to person, place, and time. He appears well-developed and well-nourished.  HENT:  Head: Normocephalic and atraumatic.  Eyes: EOM are normal. Pupils are equal, round, and reactive to light.       Fundi show grade 2AV crossing changes. No hemorrhage, exudate or papilledema.   Neck: Normal range of motion. Neck supple.       No carotid bruits.   Cardiovascular: Normal rate and regular rhythm.   No murmur heard. Pulmonary/Chest: Effort normal and breath sounds normal. No respiratory distress.  Abdominal: He exhibits no distension.  Musculoskeletal: Normal range of motion. He exhibits no edema.  Neurological: He is alert and  oriented to person, place, and time. No sensory deficit.  Skin: Skin is warm and dry. No rash noted. He is not diaphoretic.  Psychiatric: He has a normal mood and affect. His behavior is normal.    ED Course  Procedures (including critical care time) DIAGNOSTIC STUDIES: Oxygen Saturation is 100% on room air, normal by my interpretation.    COORDINATION OF CARE: 3:22 PM- Patient informed of current plan for treatment and evaluation and agrees with plan at this time. Medications ordered: 1000 ml IV fluids, reglan 10 mg and benadryl 12 mg. Ordered head CT, CBC, basic metabolic panel and  urinalysis.  Results for orders placed during the hospital encounter of 11/13/12  CBC WITH DIFFERENTIAL      Component Value Range   WBC 10.0  4.0 - 10.5 K/uL   RBC 5.21  4.22 - 5.81 MIL/uL   Hemoglobin 15.5  13.0 - 17.0 g/dL   HCT 45.0  39.0 - 52.0 %   MCV 86.4  78.0 - 100.0 fL   MCH 29.8  26.0 - 34.0 pg   MCHC 34.4  30.0 - 36.0 g/dL   RDW 13.9  11.5 - 15.5 %   Platelets 263  150 - 400 K/uL   Neutrophils Relative 61  43 - 77 %   Neutro Abs 6.1  1.7 - 7.7 K/uL   Lymphocytes Relative 29  12 - 46 %   Lymphs Abs 2.9  0.7 - 4.0 K/uL   Monocytes Relative 7  3 - 12 %   Monocytes Absolute 0.7  0.1 - 1.0 K/uL   Eosinophils Relative 2  0 - 5 %   Eosinophils Absolute 0.2  0.0 - 0.7 K/uL   Basophils Relative 0  0 - 1 %   Basophils Absolute 0.0  0.0 - 0.1 K/uL  BASIC METABOLIC PANEL      Component Value Range   Sodium 138  135 - 145 mEq/L   Potassium 4.2  3.5 - 5.1 mEq/L   Chloride 105  96 - 112 mEq/L   CO2 25  19 - 32 mEq/L   Glucose, Bld 92  70 - 99 mg/dL   BUN 28 (*) 6 - 23 mg/dL   Creatinine, Ser 2.82 (*) 0.50 - 1.35 mg/dL   Calcium 9.2  8.4 - 10.5 mg/dL   GFR calc non Af Amer 27 (*) >90 mL/min   GFR calc Af Amer 31 (*) >90 mL/min     Ct Head Wo Contrast  11/13/2012  *RADIOLOGY REPORT*  Clinical Data:  Headache  CT HEAD WITHOUT CONTRAST  Technique:  Contiguous axial images were obtained from the base of the skull through the vertex without contrast  Comparison:  None.  Findings:  The brain has a normal appearance without evidence for hemorrhage, acute infarction, hydrocephalus, or mass lesion.  There is no extra axial fluid collection.  The skull and paranasal sinuses are normal.  IMPRESSION: Normal CT of the head without contrast.   Original Report Authenticated By: Carl Best, M.D.      1. Headache   2. Hypertension   3. Renal insufficiency       MDM  Headache which seems to be more consistent with muscle contraction headache or migraine than blood pressure. Severe  hypertension but this appears to be chronic and I do not see any evidence of hypertensive encephalopathy on exam. He will be treated with a headache cocktail and reassessed. Review of old records shows a prior ED visit with severe hypertension  at which time he had some degree of renal insufficiency. Renal function will be rechecked.  He got good relief of headache with IV fluids, IV metoclopramide, and IV diphenhydramine. Creatinine is essentially unchanged from results of 10 months ago. Patient is advised of the need to maintain control blood pressure. He states that when he was taking Ziac, his blood pressure was well controlled so he is given a prescription for that. He was asked if cost was an issue and he stated it was not. He is also given a prescription for metoclopramide to use as needed and is encouraged to stop smoking. Resource guide is given and is encouraged to sign onto the health care doctor of website to get health insurance to help him cover cost of care.      I personally performed the services described in this documentation, which was scribed in my presence. The recorded information has been reviewed and is accurate.      Delora Fuel, MD [phone] [id number]

## 2013-03-17 ENCOUNTER — Encounter (HOSPITAL_COMMUNITY): Payer: Self-pay

## 2013-03-17 ENCOUNTER — Emergency Department (HOSPITAL_COMMUNITY): Payer: Self-pay

## 2013-03-17 ENCOUNTER — Emergency Department (HOSPITAL_COMMUNITY)
Admission: EM | Admit: 2013-03-17 | Discharge: 2013-03-17 | Disposition: A | Discharge status: Home / Self Care | Payer: Self-pay | Attending: Emergency Medicine | Admitting: Emergency Medicine

## 2013-03-17 DIAGNOSIS — W208XXA Other cause of strike by thrown, projected or falling object, initial encounter: Secondary | ICD-10-CM | POA: Insufficient documentation

## 2013-03-17 DIAGNOSIS — I517 Cardiomegaly: Secondary | ICD-10-CM | POA: Insufficient documentation

## 2013-03-17 DIAGNOSIS — N189 Chronic kidney disease, unspecified: Secondary | ICD-10-CM | POA: Insufficient documentation

## 2013-03-17 DIAGNOSIS — M722 Plantar fascial fibromatosis: Secondary | ICD-10-CM | POA: Insufficient documentation

## 2013-03-17 DIAGNOSIS — S90122A Contusion of left lesser toe(s) without damage to nail, initial encounter: Secondary | ICD-10-CM

## 2013-03-17 DIAGNOSIS — F17200 Nicotine dependence, unspecified, uncomplicated: Secondary | ICD-10-CM | POA: Insufficient documentation

## 2013-03-17 DIAGNOSIS — I129 Hypertensive chronic kidney disease with stage 1 through stage 4 chronic kidney disease, or unspecified chronic kidney disease: Secondary | ICD-10-CM | POA: Insufficient documentation

## 2013-03-17 DIAGNOSIS — R0609 Other forms of dyspnea: Secondary | ICD-10-CM | POA: Insufficient documentation

## 2013-03-17 DIAGNOSIS — S90129A Contusion of unspecified lesser toe(s) without damage to nail, initial encounter: Secondary | ICD-10-CM | POA: Insufficient documentation

## 2013-03-17 DIAGNOSIS — Y93G1 Activity, food preparation and clean up: Secondary | ICD-10-CM | POA: Insufficient documentation

## 2013-03-17 DIAGNOSIS — Y92009 Unspecified place in unspecified non-institutional (private) residence as the place of occurrence of the external cause: Secondary | ICD-10-CM | POA: Insufficient documentation

## 2013-03-17 DIAGNOSIS — R0989 Other specified symptoms and signs involving the circulatory and respiratory systems: Secondary | ICD-10-CM | POA: Insufficient documentation

## 2013-03-17 DIAGNOSIS — Z8781 Personal history of (healed) traumatic fracture: Secondary | ICD-10-CM | POA: Insufficient documentation

## 2013-03-17 MED ORDER — HYDROCODONE-ACETAMINOPHEN 5-325 MG PO TABS: 1.0000 | ORAL_TABLET | ORAL | Status: DC | PRN

## 2013-03-17 NOTE — ED Notes (Signed)
Pt states he usually takes his BP Med at night because it makes him sleepy

## 2013-03-17 NOTE — ED Provider Notes (Signed)
Medical screening examination/treatment/procedure(s) were performed by non-physician practitioner and as supervising physician I was immediately available for consultation/collaboration.   Maudry Diego, MD 03/17/13 [phone]

## 2013-03-17 NOTE — ED Notes (Signed)
Instructions, prescriptions and f/u information given/reviewed - verbalizes understanding.

## 2013-03-17 NOTE — ED Notes (Signed)
Pt states he dropped a frozen roast on his left great toe

## 2013-03-17 NOTE — ED Provider Notes (Signed)
History     CSN: [id number]  Arrival date & time 03/17/13  1810   First MD Initiated Contact with Patient 03/17/13 1819      Chief Complaint  Patient presents with  . Toe Injury    (Consider location/radiation/quality/duration/timing/severity/associated sxs/prior treatment) HPI Comments: Isaiah Ramos is a 37 y.o. Male presenting with left great toe pain since yesterday when he dropped a frozen roast which he was getting out of the freezer onto his left toe.  He has used ice and elevation and also took a pain pale, name unknown this morning given by his fiance which helped relieve his pain.  Weightbearing, bending and palpating the toe worsens his pain.  There is no radiation of pain with either of these activities.  He denies other injury.  Distal sensation is intact.     The history is provided by the patient.    Past Medical History  Diagnosis Date  . Hypertension   . Dyspnea on exertion   . Hypertension   . Cardiomegaly   . Plantar fasciitis   . Fracture, foot   . Kidney disease     Due to HTN    History reviewed. No pertinent past surgical history.  Family History  Problem Relation Age of Onset  . Hypertension Mother   . Hypertension Father   . Diabetes Father   . Sarcoidosis Sister     History  Substance Use Topics  . Smoking status: Smoker, Current Status Unknown  . Smokeless tobacco: Not on file  . Alcohol Use: Yes      Review of Systems  Constitutional: Negative for fever.  HENT: Negative for congestion, sore throat and neck pain.   Eyes: Negative.   Respiratory: Negative for chest tightness and shortness of breath.   Cardiovascular: Negative for chest pain.  Gastrointestinal: Negative for nausea and abdominal pain.  Genitourinary: Negative.   Musculoskeletal: Negative for joint swelling and arthralgias.  Skin: Negative.  Negative for rash and wound.  Neurological: Negative for dizziness, weakness, light-headedness, numbness and headaches.   Psychiatric/Behavioral: Negative.     Allergies  Review of patient's allergies indicates no known allergies.  Home Medications   Current Outpatient Rx  Name  Route  Sig  Dispense  Refill  . bisoprolol-hydrochlorothiazide (ZIAC) 5-6.25 MG per tablet   Oral   Take 1 tablet by mouth daily.   90 tablet   0   . HYDROcodone-acetaminophen (NORCO/VICODIN) 5-325 MG per tablet   Oral   Take 1 tablet by mouth every 4 (four) hours as needed for pain.   20 tablet   0     BP 176/102  Pulse 75  Temp(Src) 98.4 F (36.9 C) (Oral)  Resp 20  Ht 6\' 5"  (1.956 m)  Wt 250 lb (113.399 kg)  BMI 29.64 kg/m2  SpO2 100%  Physical Exam  Constitutional: He appears well-developed and well-nourished.  HENT:  Head: Atraumatic.  Neck: Normal range of motion.  Cardiovascular:  Pulses equal bilaterally  Musculoskeletal: He exhibits tenderness.       Left foot: He exhibits tenderness and swelling. He exhibits normal capillary refill and no deformity.  Tender to palpation limited to left great toe only.  He has moderate edema, no erythema, no lacerations or obvious skin injury.  He does have nail which looks suspicious for onychomycosis as they are thickened and yellow.  No sign of infection.  Neurological: He is alert. He has normal strength. He displays normal reflexes. No sensory deficit.  Equal  strength  Skin: Skin is warm and dry.  Psychiatric: He has a normal mood and affect.    ED Course  Procedures (including critical care time)  Labs Reviewed - No data to display Dg Toe Great Left  03/17/2013  *RADIOLOGY REPORT*  Clinical Data: Toe pain/injury  LEFT GREAT TOE  Comparison: None.  Findings: No acute fracture or malalignment.  Normal bony mineralization.  Soft tissue swelling about the distal great toe.  IMPRESSION: No acute osseous injury.   Original Report Authenticated By: Jacqulynn Cadet, M.D.      1. Toe contusion, left, initial encounter       MDM  Patient prescribed  hydrocodone.  Also encouraged ice and elevation.  He was placed in a postop shoe.  Referrals given for primary care.  When necessary anticipated.  Patients labs and/or radiological studies were viewed and considered during the medical decision making and disposition process.         Evalee Jefferson, PA-C 03/17/13 1948

## 2013-03-18 ENCOUNTER — Ambulatory Visit: Payer: Self-pay | Admitting: Orthopedic Surgery

## 2013-04-07 ENCOUNTER — Encounter: Payer: Self-pay | Admitting: Orthopedic Surgery

## 2013-04-07 ENCOUNTER — Ambulatory Visit (INDEPENDENT_AMBULATORY_CARE_PROVIDER_SITE_OTHER): Payer: BC Managed Care – PPO | Admitting: Orthopedic Surgery

## 2013-04-07 ENCOUNTER — Ambulatory Visit (INDEPENDENT_AMBULATORY_CARE_PROVIDER_SITE_OTHER): Payer: BC Managed Care – PPO

## 2013-04-07 VITALS — BP 190/110 | Ht 77.0 in | Wt 250.0 lb

## 2013-04-07 DIAGNOSIS — M75101 Unspecified rotator cuff tear or rupture of right shoulder, not specified as traumatic: Secondary | ICD-10-CM | POA: Insufficient documentation

## 2013-04-07 DIAGNOSIS — M25511 Pain in right shoulder: Secondary | ICD-10-CM

## 2013-04-07 DIAGNOSIS — M25519 Pain in unspecified shoulder: Secondary | ICD-10-CM

## 2013-04-07 DIAGNOSIS — M67919 Unspecified disorder of synovium and tendon, unspecified shoulder: Secondary | ICD-10-CM

## 2013-04-07 MED ORDER — HYDROCODONE-ACETAMINOPHEN 5-325 MG PO TABS: 1.0000 | ORAL_TABLET | Freq: Four times a day (QID) | ORAL | Status: DC | PRN

## 2013-04-07 NOTE — Progress Notes (Signed)
  Subjective:    Patient ID: Isaiah Ramos, male    DOB: 08-18-1976, 37 y.o.   MRN: [id number]  HPI Comments: Approximately 2 years ago the patient was lifting weights and he noticed that his right shoulder became weak and was unable to bench press or military (with the same amount of weight  Shoulder Pain  The pain is present in the right shoulder. This is a chronic problem. The current episode started more than 1 year ago. There has been a history of trauma. The problem occurs constantly. The problem has been gradually worsening. The quality of the pain is described as sharp. The pain is at a severity of 8/10. Associated symptoms include joint locking and a limited range of motion. Pertinent negatives include no fever, inability to bear weight, itching, joint swelling, numbness, stiffness or tingling.      Review of Systems  Constitutional: Negative for fever.  Musculoskeletal: Negative for stiffness.  Skin: Negative for itching.  Allergic/Immunologic: Positive for environmental allergies.  Neurological: Negative for tingling and numbness.  All other systems reviewed and are negative.       Objective:   Physical Exam  BP 190/110  Ht 6\' 5"  (1.956 m)  Wt 250 lb (113.399 kg)  BMI 29.64 kg/m2 new to my practice  Vital signs are stable as recorded  General appearance is normal  The patient is alert and oriented x3  The patient's mood and affect are normal  Gait assessment: Noncontributory   The cardiovascular exam reveals normal pulses and temperature without edema or  swelling.  The lymphatic system is negative for palpable lymph nodes in the cervical spine or axilla  The sensory exam is normal.  There are no pathologic reflexes.  Balance is normal.   Exam of the cervical spine shows no tenderness and normal range of motion  Exam of the left shoulder shows full range of motion without contracture subluxation atrophy or tremor  Exam of the right shoulder reveals  there is tenderness over the biceps tendon and inferior to the posterior acromion there is decreased internal rotation decreased for elevation   shoulder stability tests were normal. There is weakness in the supraspinatus but normal strength in the infraspinatus external rotators and internal rotators skin was intact  Impingement signs were positive for Neer and Hawkins    Assessment & Plan:   Pain in joint, shoulder region, right - Plan: DG Shoulder Right  Rotator cuff syndrome of right shoulder  With rule out rotator cuff tear as part of the diagnostic workup  Recommend subacromial injection return in 4 weeks for reassessment  Shoulder Injection Procedure Note   Pre-operative Diagnosis: right  RC Syndrome  Post-operative Diagnosis: same  Indications: pain   Anesthesia: ethyl chloride   Procedure Details   Verbal consent was obtained for the procedure. The shoulder was prepped withalcohol and the skin was anesthetized. A 20 gauge needle was advanced into the subacromial space through posterior approach without difficulty  The space was then injected with 3 ml 1% lidocaine and 1 ml of depomedrol. The injection site was cleansed with isopropyl alcohol and a dressing was applied.  Complications:  None; patient tolerated the procedure well.

## 2013-04-07 NOTE — Patient Instructions (Signed)
You have received a steroid shot. 15% of patients experience increased pain at the injection site with in the next 24 hours. This is best treated with ice and tylenol extra strength 2 tabs every 8 hours. If you are still having pain please call the office.   Impingement Syndrome, Rotator Cuff, Bursitis with Rehab Impingement syndrome is a condition that involves inflammation of the tendons of the rotator cuff and the subacromial bursa, that causes pain in the shoulder. The rotator cuff consists of four tendons and muscles that control much of the shoulder and upper arm function. The subacromial bursa is a fluid filled sac that helps reduce friction between the rotator cuff and one of the bones of the shoulder (acromion). Impingement syndrome is usually an overuse injury that causes swelling of the bursa (bursitis), swelling of the tendon (tendonitis), and/or a tear of the tendon (strain). Strains are classified into three categories. Grade 1 strains cause pain, but the tendon is not lengthened. Grade 2 strains include a lengthened ligament, due to the ligament being stretched or partially ruptured. With grade 2 strains there is still function, although the function may be decreased. Grade 3 strains include a complete tear of the tendon or muscle, and function is usually impaired. SYMPTOMS    Pain around the shoulder, often at the outer portion of the upper arm.   Pain that gets worse with shoulder function, especially when reaching overhead or lifting.   Sometimes, aching when not using the arm.   Pain that wakes you up at night.   Sometimes, tenderness, swelling, warmth, or redness over the affected area.   Loss of strength.   Limited motion of the shoulder, especially reaching behind the back (to the back pocket or to unhook bra) or across your body.   Crackling sound (crepitation) when moving the arm.   Biceps tendon pain and inflammation (in the front of the shoulder). Worse when bending  the elbow or lifting.  CAUSES   Impingement syndrome is often an overuse injury, in which chronic (repetitive) motions cause the tendons or bursa to become inflamed. A strain occurs when a force is paced on the tendon or muscle that is greater than it can withstand. Common mechanisms of injury include: Stress from sudden increase in duration, frequency, or intensity of training.  Direct hit (trauma) to the shoulder.   Aging, erosion of the tendon with normal use.   Bony bump on shoulder (acromial spur).  RISK INCREASES WITH:  Contact sports (football, wrestling, boxing).   Throwing sports (baseball, tennis, volleyball).   Weightlifting and bodybuilding.   Heavy labor.   Previous injury to the rotator cuff, including impingement.   Poor shoulder strength and flexibility.   Failure to warm up properly before activity.   Inadequate protective equipment.   Old age.   Bony bump on shoulder (acromial spur).  PREVENTION    Warm up and stretch properly before activity.   Allow for adequate recovery between workouts.   Maintain physical fitness:   Strength, flexibility, and endurance.   Cardiovascular fitness.   Learn and use proper exercise technique.  PROGNOSIS   If treated properly, impingement syndrome usually goes away within 6 weeks. Sometimes surgery is required.   RELATED COMPLICATIONS    Longer healing time if not properly treated, or if not given enough time to heal.   Recurring symptoms, that result in a chronic condition.   Shoulder stiffness, frozen shoulder, or loss of motion.   Rotator cuff tendon tear.     Recurring symptoms, especially if activity is resumed too soon, with overuse, with a direct blow, or when using poor technique.  TREATMENT   Treatment first involves the use of ice and medicine, to reduce pain and inflammation. The use of strengthening and stretching exercises may help reduce pain with activity. These exercises may be performed at home  or with a therapist. If non-surgical treatment is unsuccessful after more than 6 months, surgery may be advised. After surgery and rehabilitation, activity is usually possible in 3 months.   MEDICATION  If pain medicine is needed, nonsteroidal anti-inflammatory medicines (aspirin and ibuprofen), or other minor pain relievers (acetaminophen), are often advised.   Do not take pain medicine for 7 days before surgery.   Prescription pain relievers may be given, if your caregiver thinks they are needed. Use only as directed and only as much as you need.   Corticosteroid injections may be given by your caregiver. These injections should be reserved for the most serious cases, because they may only be given a certain number of times.  HEAT AND COLD  Cold treatment (icing) should be applied for 10 to 15 minutes every 2 to 3 hours for inflammation and pain, and immediately after activity that aggravates your symptoms. Use ice packs or an ice massage.   Heat treatment may be used before performing stretching and strengthening activities prescribed by your caregiver, physical therapist, or athletic trainer. Use a heat pack or a warm water soak.  SEEK MEDICAL CARE IF:    Symptoms get worse or do not improve in 4 to 6 weeks, despite treatment.   New, unexplained symptoms develop. (Drugs used in treatment may produce side effects.)   

## 2013-05-06 ENCOUNTER — Encounter: Payer: Self-pay | Admitting: Orthopedic Surgery

## 2013-05-06 ENCOUNTER — Ambulatory Visit (INDEPENDENT_AMBULATORY_CARE_PROVIDER_SITE_OTHER): Payer: BC Managed Care – PPO | Admitting: Orthopedic Surgery

## 2013-05-06 VITALS — BP 164/100 | Ht 77.0 in | Wt 250.0 lb

## 2013-05-06 DIAGNOSIS — M719 Bursopathy, unspecified: Secondary | ICD-10-CM

## 2013-05-06 DIAGNOSIS — M67919 Unspecified disorder of synovium and tendon, unspecified shoulder: Secondary | ICD-10-CM

## 2013-05-06 DIAGNOSIS — M75101 Unspecified rotator cuff tear or rupture of right shoulder, not specified as traumatic: Secondary | ICD-10-CM

## 2013-05-06 NOTE — Progress Notes (Signed)
Patient ID: Isaiah Ramos, male   DOB: November 18, 1976, 37 y.o.   MRN: [id number] Chief Complaint  Patient presents with  . Follow-up    4 week recheck on right shoulder post injection.    Recheck right shoulder after injection and home exercises  The patient has a history of 2 years pain in the anterior aspect of the right shoulder with weakness  The injection improved his symptoms slightly but he still has weakness and pain in the front of the shoulder and pain with abduction weakness with abduction and pain with overhead activity  Review of systems he continues to deny any numbness in the arm General appearance is normal, the patient is alert and oriented x3 with normal mood and affect. BP 164/100  Ht 6\' 5"  (1.956 m)  Wt 250 lb (113.399 kg)  BMI 29.64 kg/m2 Painful elevation of the right shoulder no instability negative apprehension weakness of the supraspinatus skin intact neurovascular exam normal  Recommend MRI shoulder and followup after MRI

## 2013-05-10 ENCOUNTER — Telehealth: Payer: Self-pay | Admitting: Radiology

## 2013-05-10 NOTE — Telephone Encounter (Signed)
Patient has MRI at [facility] on 05-13-13 at 2:45. Patient has [employer], authorization # [id number] and it expires on 06-08-13. Patient will follow up back here in the office for his results.

## 2013-05-13 ENCOUNTER — Ambulatory Visit (HOSPITAL_COMMUNITY)
Admission: RE | Admit: 2013-05-13 | Discharge: 2013-05-13 | Disposition: A | Discharge status: Home / Self Care | Payer: BC Managed Care – PPO | Source: Ambulatory Visit | Attending: Orthopedic Surgery | Admitting: Orthopedic Surgery

## 2013-05-13 DIAGNOSIS — M75101 Unspecified rotator cuff tear or rupture of right shoulder, not specified as traumatic: Secondary | ICD-10-CM

## 2013-05-13 DIAGNOSIS — M67919 Unspecified disorder of synovium and tendon, unspecified shoulder: Secondary | ICD-10-CM | POA: Insufficient documentation

## 2013-05-13 DIAGNOSIS — M719 Bursopathy, unspecified: Secondary | ICD-10-CM | POA: Insufficient documentation

## 2013-05-13 DIAGNOSIS — M25519 Pain in unspecified shoulder: Secondary | ICD-10-CM | POA: Insufficient documentation

## 2013-05-24 ENCOUNTER — Telehealth: Payer: Self-pay | Admitting: Orthopedic Surgery

## 2013-05-24 ENCOUNTER — Ambulatory Visit: Payer: BC Managed Care – PPO | Admitting: Orthopedic Surgery

## 2013-05-24 NOTE — Telephone Encounter (Signed)
YES

## 2013-05-24 NOTE — Telephone Encounter (Signed)
ok 

## 2013-05-24 NOTE — Telephone Encounter (Signed)
Isaiah Ramos was unable to come to his appointment today, and he asked if you can call him with his MRI results. His # [phone]

## 2013-05-29 ENCOUNTER — Other Ambulatory Visit: Payer: Self-pay | Admitting: Orthopedic Surgery

## 2013-05-29 DIAGNOSIS — M19019 Primary osteoarthritis, unspecified shoulder: Secondary | ICD-10-CM

## 2013-05-29 DIAGNOSIS — M7581 Other shoulder lesions, right shoulder: Secondary | ICD-10-CM

## 2013-05-29 MED ORDER — DICLOFENAC POTASSIUM 50 MG PO TABS: 50.0000 mg | ORAL_TABLET | Freq: Two times a day (BID) | ORAL | Status: DC

## 2013-05-29 MED ORDER — PREDNISONE 10 MG PO KIT: 10.0000 mg | PACK | ORAL | Status: DC

## 2013-05-29 NOTE — Progress Notes (Signed)
I called the patient I gave him his MRI results supraspinatus infraspinatus tendinopathy without tear, mild to moderate a.c. joint degenerative change  Recommend continue Norco every 6 and add anti-inflammatory all start with prednisone Dosepak and then follow that with Naprosyn or diclofenac

## 2013-06-29 ENCOUNTER — Encounter: Payer: Self-pay | Admitting: Orthopedic Surgery

## 2013-06-29 ENCOUNTER — Ambulatory Visit: Payer: BC Managed Care – PPO | Admitting: Orthopedic Surgery

## 2014-07-21 ENCOUNTER — Encounter (HOSPITAL_COMMUNITY): Payer: Self-pay | Admitting: Emergency Medicine

## 2014-07-21 ENCOUNTER — Emergency Department (HOSPITAL_COMMUNITY)
Admission: EM | Admit: 2014-07-21 | Discharge: 2014-07-21 | Disposition: A | Discharge status: Home / Self Care | Payer: BC Managed Care – PPO | Attending: Emergency Medicine | Admitting: Emergency Medicine

## 2014-07-21 ENCOUNTER — Emergency Department (HOSPITAL_COMMUNITY): Payer: BC Managed Care – PPO

## 2014-07-21 DIAGNOSIS — N289 Disorder of kidney and ureter, unspecified: Secondary | ICD-10-CM | POA: Insufficient documentation

## 2014-07-21 DIAGNOSIS — Z8781 Personal history of (healed) traumatic fracture: Secondary | ICD-10-CM | POA: Insufficient documentation

## 2014-07-21 DIAGNOSIS — Z79899 Other long term (current) drug therapy: Secondary | ICD-10-CM | POA: Insufficient documentation

## 2014-07-21 DIAGNOSIS — Z7982 Long term (current) use of aspirin: Secondary | ICD-10-CM | POA: Insufficient documentation

## 2014-07-21 DIAGNOSIS — I10 Essential (primary) hypertension: Secondary | ICD-10-CM

## 2014-07-21 DIAGNOSIS — F17200 Nicotine dependence, unspecified, uncomplicated: Secondary | ICD-10-CM | POA: Insufficient documentation

## 2014-07-21 DIAGNOSIS — R079 Chest pain, unspecified: Secondary | ICD-10-CM | POA: Insufficient documentation

## 2014-07-21 LAB — CBC
HCT: 44.4 % (ref 39.0–52.0)
HEMOGLOBIN: 15.2 g/dL (ref 13.0–17.0)
MCH: 28.9 pg (ref 26.0–34.0)
MCHC: 34.2 g/dL (ref 30.0–36.0)
MCV: 84.4 fL (ref 78.0–100.0)
PLATELETS: 249 10*3/uL (ref 150–400)
RBC: 5.26 MIL/uL (ref 4.22–5.81)
RDW: 14.3 % (ref 11.5–15.5)
WBC: 7.7 10*3/uL (ref 4.0–10.5)

## 2014-07-21 LAB — BASIC METABOLIC PANEL
ANION GAP: 12 (ref 5–15)
BUN: 25 mg/dL — ABNORMAL HIGH (ref 6–23)
CO2: 23 mEq/L (ref 19–32)
Calcium: 8.8 mg/dL (ref 8.4–10.5)
Chloride: 104 mEq/L (ref 96–112)
Creatinine, Ser: 3.06 mg/dL — ABNORMAL HIGH (ref 0.50–1.35)
GFR, EST AFRICAN AMERICAN: 28 mL/min — AB (ref >90)
GFR, EST NON AFRICAN AMERICAN: 24 mL/min — AB (ref >90)
GLUCOSE: 89 mg/dL (ref 70–99)
POTASSIUM: 4 meq/L (ref 3.7–5.3)
SODIUM: 139 meq/L (ref 137–147)

## 2014-07-21 LAB — TROPONIN I

## 2014-07-21 MED ORDER — LABETALOL HCL 5 MG/ML IV SOLN
20.0000 mg | Freq: Once | INTRAVENOUS | Status: AC
  Administered 2014-07-21: 20 mg via INTRAVENOUS
  Filled 2014-07-21: qty 4

## 2014-07-21 MED ORDER — NITROGLYCERIN 0.4 MG SL SUBL
0.4000 mg | SUBLINGUAL_TABLET | SUBLINGUAL | Status: DC | PRN
Start: 2014-07-21 — End: 2014-07-22
  Administered 2014-07-21 (×3): 0.4 mg via SUBLINGUAL
  Filled 2014-07-21 (×3): qty 1

## 2014-07-21 MED ORDER — METOPROLOL TARTRATE 50 MG PO TABS: 50.0000 mg | ORAL_TABLET | Freq: Two times a day (BID) | ORAL | Status: DC

## 2014-07-21 NOTE — ED Notes (Addendum)
Pt reports sent here from PCP office. Pt reports was told to come here for high bp. Pt reports went to pcp today to get bp meds refilled. PCP gave px for aspirin and sent here.Pt bp in PCP office 229/132. Pt reports took 324mg  of baby aspirin prior to arrival from PCP. Pt c/o of headache and light sensitivity but denies dizziness,sob. Pt reports intermittent cp radiating to left arm and intermittent numbness. Speech clear. nad noted.

## 2014-07-21 NOTE — ED Provider Notes (Signed)
CSN: [id number]     Arrival date & time 07/21/14  1727 History   First MD Initiated Contact with Patient 07/21/14 1751     Chief Complaint  Patient presents with  . Hypertension     (Consider location/radiation/quality/duration/timing/severity/associated sxs/prior Treatment) HPI Comments: Patient presents to the ER for evaluation of elevated blood pressure. Patient does have a history of hypertension, has not been taking his medications for quite some time. This is primary care doctor today to get refill of his medications and his blood pressure was very high, was sent to the ER for further evaluation. Patient does report that he has been having some intermittent headaches recently. He also reports that he has had intermittent chest pain, but this has been ongoing for a long time. Pain comes on in various places in his chest lasts for a couple of minutes and then goes away. It has not been related to exertion. Pain occurs at rest.  Patient is a 38 y.o. male presenting with hypertension.  Hypertension Associated symptoms include chest pain and headaches. Pertinent negatives include no shortness of breath.    Past Medical History  Diagnosis Date  . Hypertension   . Dyspnea on exertion   . Hypertension   . Cardiomegaly   . Plantar fasciitis   . Fracture, foot   . Kidney disease     Due to HTN   Past Surgical History  Procedure Laterality Date  . No past surgeries     Family History  Problem Relation Age of Onset  . Hypertension Mother   . Hypertension Father   . Diabetes Father   . Sarcoidosis Sister    History  Substance Use Topics  . Smoking status: Current Every Day Smoker -- 1.00 packs/day  . Smokeless tobacco: Not on file  . Alcohol Use: Yes     Comment: rare    Review of Systems  Respiratory: Negative for shortness of breath.   Cardiovascular: Positive for chest pain.  Neurological: Positive for headaches.  All other systems reviewed and are  negative.     Allergies  Review of patient's allergies indicates no known allergies.  Home Medications   Prior to Admission medications   Medication Sig Start Date End Date Taking? Authorizing Provider  aspirin EC 81 MG tablet Take 324 mg by mouth once as needed for moderate pain.   Yes Historical Provider, MD  ibuprofen (ADVIL,MOTRIN) 200 MG tablet Take 1,000 mg by mouth 2 (two) times daily as needed for headache, mild pain or moderate pain.   Yes Historical Provider, MD  loratadine (CLARITIN) 10 MG tablet Take 10 mg by mouth daily.   Yes Historical Provider, MD   BP 204/113  Pulse 88  Temp(Src) 99 F (37.2 C) (Oral)  Resp 20  Ht 6\' 5"  (1.956 m)  Wt 243 lb (110.224 kg)  BMI 28.81 kg/m2  SpO2 100% Physical Exam  Constitutional: He is oriented to person, place, and time. He appears well-developed and well-nourished. No distress.  HENT:  Head: Normocephalic and atraumatic.  Right Ear: Hearing normal.  Left Ear: Hearing normal.  Nose: Nose normal.  Mouth/Throat: Oropharynx is clear and moist and mucous membranes are normal.  Eyes: Conjunctivae and EOM are normal. Pupils are equal, round, and reactive to light.  Neck: Normal range of motion. Neck supple.  Cardiovascular: Regular rhythm, S1 normal and S2 normal.  Exam reveals no gallop and no friction rub.   No murmur heard. Pulmonary/Chest: Effort normal and breath sounds normal. No  respiratory distress. He exhibits no tenderness.  Abdominal: Soft. Normal appearance and bowel sounds are normal. There is no hepatosplenomegaly. There is no tenderness. There is no rebound, no guarding, no tenderness at McBurney's point and negative Murphy's sign. No hernia.  Musculoskeletal: Normal range of motion.  Neurological: He is alert and oriented to person, place, and time. He has normal strength. No cranial nerve deficit or sensory deficit. Coordination normal. GCS eye subscore is 4. GCS verbal subscore is 5. GCS motor subscore is 6.   Extraocular muscle movement: normal No visual field cut Pupils: equal and reactive both direct and consensual response is normal No nystagmus present  Sensory function is intact to light touch, pinprick Proprioception intact  Grip strength 5/5 symmetric in upper extremities Lower extremity strength 5/5 against gravity No pronator drift Normal finger to nose bilaterally Normal heel to shin bilaterally  Gait: normal Rhomberg: normal   Skin: Skin is warm, dry and intact. No rash noted. No cyanosis.  Psychiatric: He has a normal mood and affect. His speech is normal and behavior is normal. Thought content normal.    ED Course  Procedures (including critical care time) Labs Review Labs Reviewed  BASIC METABOLIC PANEL - Abnormal; Notable for the following:    BUN 25 (*)    Creatinine, Ser 3.06 (*)    GFR calc non Af Amer 24 (*)    GFR calc Af Amer 28 (*)    All other components within normal limits  TROPONIN I  CBC    Imaging Review Dg Chest Port 1 View  07/21/2014   CLINICAL DATA:  Mid chest pain radiating down the left arm  EXAM: PORTABLE CHEST - 1 VIEW  COMPARISON:  None.  FINDINGS: The heart size and mediastinal contours are within normal limits. Both lungs are clear. The visualized skeletal structures are unremarkable.  IMPRESSION: No active disease.   Electronically Signed   By: Kathreen Devoid   On: 07/21/2014 18:44     EKG Interpretation   Date/Time:  Thursday July 21 2014 17:41:08 EDT Ventricular Rate:  98 PR Interval:  158 QRS Duration: 78 QT Interval:  374 QTC Calculation: 477 R Axis:   0 Text Interpretation:  Normal sinus rhythm Biatrial enlargement Left  ventricular hypertrophy with repolarization abnormality Abnormal ECG No  previous tracing Confirmed by POLLINA  MD, CHRISTOPHER [id number]) on  07/21/2014 6:17:37 PM      MDM   Final diagnoses:  None   hypertension  Patient presents to the ER for evaluation elevated blood pressure. He also admits to  intermittent headaches and chest pain, the symptoms have been present intermittently somewhat chronically. EKG shows evidence of LVH. No obvious ST elevation or infarct. Troponin was negative, patient is currently pain-free here in the ER.  Patient administered nitroglycerin sublingually initially for the blood pressure without improvement. He was administered labetalol. He had some improvement with labetalol. At this point the case was discussed with Doctor Nehemiah Settle, on call for hospitalist service. Patient's EKG findings, blood work and current presentation were discussed. Doctor Nehemiah Settle did not feel that the patient was exhibiting any evidence of endorgan injury. He did not feel that the chest pain was something that required hospitalization. He recommended continued IV antihypertensive here in the ER followed by discharge to followup with primary doctor. He did recommend switching the patient from Zestril to Lopressor based on the patient's chronic renal insufficiency.    Orpah Greek, MD 07/21/14 2225

## 2014-07-21 NOTE — Discharge Instructions (Signed)
Followup with primary doctor for blood pressure recheck as soon as possible. If you have any increasing headache, numbness, tingling or weakness of an extremity, chest pain, return to the ER immediately  Hypertension Hypertension, commonly called high blood pressure, is when the force of blood pumping through your arteries is too strong. Your arteries are the blood vessels that carry blood from your heart throughout your body. A blood pressure reading consists of a higher number over a lower number, such as 110/72. The higher number (systolic) is the pressure inside your arteries when your heart pumps. The lower number (diastolic) is the pressure inside your arteries when your heart relaxes. Ideally you want your blood pressure below 120/80. Hypertension forces your heart to work harder to pump blood. Your arteries may become narrow or stiff. Having hypertension puts you at risk for heart disease, stroke, and other problems.  RISK FACTORS Some risk factors for high blood pressure are controllable. Others are not.  Risk factors you cannot control include:   Race. You may be at higher risk if you are African American.  Age. Risk increases with age.  Gender. Men are at higher risk than women before age 49 years. After age 59, women are at higher risk than men. Risk factors you can control include:  Not getting enough exercise or physical activity.  Being overweight.  Getting too much fat, sugar, calories, or salt in your diet.  Drinking too much alcohol. SIGNS AND SYMPTOMS Hypertension does not usually cause signs or symptoms. Extremely high blood pressure (hypertensive crisis) may cause headache, anxiety, shortness of breath, and nosebleed. DIAGNOSIS  To check if you have hypertension, your health care provider will measure your blood pressure while you are seated, with your arm held at the level of your heart. It should be measured at least twice using the same arm. Certain conditions can  cause a difference in blood pressure between your right and left arms. A blood pressure reading that is higher than normal on one occasion does not mean that you need treatment. If one blood pressure reading is high, ask your health care provider about having it checked again. TREATMENT  Treating high blood pressure includes making lifestyle changes and possibly taking medicine. Living a healthy lifestyle can help lower high blood pressure. You may need to change some of your habits. Lifestyle changes may include:  Following the DASH diet. This diet is high in fruits, vegetables, and whole grains. It is low in salt, red meat, and added sugars.  Getting at least 2 hours of brisk physical activity every week.  Losing weight if necessary.  Not smoking.  Limiting alcoholic beverages.  Learning ways to reduce stress. If lifestyle changes are not enough to get your blood pressure under control, your health care provider may prescribe medicine. You may need to take more than one. Work closely with your health care provider to understand the risks and benefits. HOME CARE INSTRUCTIONS  Have your blood pressure rechecked as directed by your health care provider.   Take medicines only as directed by your health care provider. Follow the directions carefully. Blood pressure medicines must be taken as prescribed. The medicine does not work as well when you skip doses. Skipping doses also puts you at risk for problems.   Do not smoke.   Monitor your blood pressure at home as directed by your health care provider. SEEK MEDICAL CARE IF:   You think you are having a reaction to medicines taken.  You  have recurrent headaches or feel dizzy.  You have swelling in your ankles.  You have trouble with your vision. SEEK IMMEDIATE MEDICAL CARE IF:  You develop a severe headache or confusion.  You have unusual weakness, numbness, or feel faint.  You have severe chest or abdominal pain.  You  vomit repeatedly.  You have trouble breathing. MAKE SURE YOU:   Understand these instructions.  Will watch your condition.  Will get help right away if you are not doing well or get worse. Document Released: 11/25/2005 Document Revised: 04/11/2014 Document Reviewed: 09/17/2013 [facility] Patient Information 2015 [place], [place]. This information is not intended to replace advice given to you by your health care provider. Make sure you discuss any questions you have with your health care provider.   Emergency Department Resource Guide 1) Find a Doctor and Pay Out of Pocket Although you won't have to find out who is covered by your insurance plan, it is a good idea to ask around and get recommendations. You will then need to call the office and see if the doctor you have chosen will accept you as a new patient and what types of options they offer for patients who are self-pay. Some doctors offer discounts or will set up payment plans for their patients who do not have insurance, but you will need to ask so you aren't surprised when you get to your appointment.  2) Contact Your Local Health Department Not all health departments have doctors that can see patients for sick visits, but many do, so it is worth a call to see if yours does. If you don't know where your local health department is, you can check in your phone book. The CDC also has a tool to help you locate your state's health department, and many state websites also have listings of all of their local health departments.  3) Find a [facility] If your illness is not likely to be very severe or complicated, you may want to try a walk in clinic. These are popping up all over the country in pharmacies, drugstores, and shopping centers. They're usually staffed by nurse practitioners or physician assistants that have been trained to treat common illnesses and complaints. They're usually fairly quick and inexpensive. However, if you have  serious medical issues or chronic medical problems, these are probably not your best option.  No Primary Care Doctor: - Call Health Connect at  [phone] - they can help you locate a primary care doctor that  accepts your insurance, provides certain services, etc. - Physician Referral Service- [phone]  Chronic Pain Problems: Organization         Address  Phone   Notes  [facility]  [phone] Patients need to be referred by their primary care doctor.   Medication Assistance: Organization         Address  Phone   Notes  [facility] Medication [facility] [address] [place], [place] [address] [phone] --Must be a resident of [facility] -- Must have NO insurance coverage whatsoever (no Medicaid/ Medicare, etc.) -- The pt. MUST have a primary care doctor that directs their care regularly and follows them in the community   Med[facility]ist  [phone]   [employer]  [phone]    Agencies that provide inexpensive medical care: Organization         Address  Phone   Notes  [place]  [phone]  [facility]    [phone]   [facility] [place], [place] [address] [phone]   [place]. [address] [place] [phone]   Planned Parenthood    [phone]   [facility]    [phone]   [place] and [place] [address], [place] Phone:  [phone], Fax:  [phone] Hours of Operation:  9 am - 6 pm, M-F.  Also accepts Medicaid/Medicare and self-pay.  [facility] for [place], [address], [place] Phone: [phone], Fax: [phone]. Hours of Operation:  8:30 am - 5:30 pm, M-F.  Also accepts Medicaid and self-pay.  [facility] [place] [address], [place] Phone: [phone]   [place],  [place], [place] [phone], Ext. [phone] Mondays & Thursdays: 7-9 AM.  First 15 patients are seen on a first come, first serve basis.    [place] Providers:  Organization         Address  Phone   Notes  [facility] [address], [place] [phone] Also accepts self-pay patients.  [facility] [id number] [place], [place]  [phone]   [place], [address], [place] [phone]   [facility] [address] [place] [phone]   Lucianne Lei [address], [place]   [phone] Only accepts [place] Access [place] patients after they have their name applied to their card.   Self-Pay (no insurance) in [facility]:  Organization         Address  Phone   Notes  Sickle Cell Patients, [facility] [place] [phone]   [facility] [place] [phone]   [facility] [place]  [place], [place], [place] [phone]   [facility]/Dr. Osei-Bonsu  [address] [place] or [address], [place] [phone] Phone number for both [place] and [place] locations is the same.  Urgent Medical and [facility] [address], [place] [phone]   [facility] [address], [place] or [address] [phone]   [facility] [address] [place] [phone], phone; [phone], fax Sees patients 1st and 3rd Saturday of every month.  Must not qualify for public or private insurance (i.e. Medicaid, Medicare, [place] Health Choice, Veterans' Benefits)  Household income should be no more than 200% of the poverty level The clinic cannot treat you if you are pregnant or think you are pregnant   Sexually transmitted diseases are not treated at the clinic.    Dental Care: Organization         Address  Phone  Notes  [facility] Department of [facility] [phone] Accepts children up to age 90 who are enrolled in [place] or [place]; pregnant women with a Medicaid card; and children who have applied for Medicaid or [place] Health Choice, but were declined, whose parents can pay a reduced fee at time of service.  [facility] Department of [facility]  [address],  [place] [phone] Accepts children up to age 24 who are enrolled in Medicaid or [place] Health Choice; pregnant women with a Medicaid card; and children who have applied for Medicaid or [place] Health Choice, but were declined, whose parents can pay a reduced fee at time of service.  [place] Adult Dental Access PROGRAM  [place] [phone] Patients are seen by appointment only. Walk-ins are not accepted. [place] will see patients 57 years of age and older. Monday - Tuesday (8am-5pm) Most Wednesdays (8:30-5pm) $30 per visit, cash only  [facility] Adult Dental Access PROGRAM  [address], [facility] [phone] Patients are seen by appointment only. Walk-ins are not accepted. [place] will see patients 53 years of age and older. One Wednesday Evening (Monthly: Volunteer Based).  $30 per visit, cash only  [place]  [phone] for adults; Children under age 35, call Graduate Pediatric Dentistry at [phone]. Children aged 86-14, please call [phone] to request a pediatric application.  Dental services are provided in all areas of dental care including fillings, crowns and bridges, complete and partial dentures, implants, gum treatment, root canals, and extractions. Preventive care is also provided. Treatment is provided to both adults and children. Patients  are selected via a lottery and there is often a waiting list.   [facility] [address], [place]  [phone] [email or web address]   [facility] [address] [place], [place] [phone], Ext. [phone] Second and Fourth Thursday of each month, opens at 6:30 AM; Clinic ends at 9 AM.  Patients are seen on a first-come first-served basis, and a limited number are seen during each clinic.   [facility]  [address] Hillard Danker [place], [place] [phone]   Eligibility Requirements You must have lived in [place], [place], or [place] counties for at least the last three months.   You cannot be eligible for state or federal sponsored [employer], including [employer], [place], or [employer].   You generally cannot be eligible for healthcare insurance through your employer.    How to apply: Eligibility screenings are held every Tuesday and Wednesday afternoon from 1:00 pm until 4:00 pm. You do not need an appointment for the interview!  [facility] [address] [place], [place]   [place]  [place] Department  [place]  [phone]    Behavioral Health Resources in the Community: Intensive Outpatient Programs Organization         Address  Phone  Notes  [place]. [address] [place], [place] [phone]   [facility] [address], [place], [place]   ADS: Alcohol & Drug Svcs [address], [place], [place]   [place] [address]  [place], [place] or [phone]   Substance Abuse Resources Organization         Address  Phone  Notes  Alcohol and Drug Services  [phone]   [place]  [phone]   [facility]   Chinita Pester  [phone]    Residential & Outpatient Substance Abuse Program  [phone]   Psychological Services Organization         Address  Phone  Notes  [place]  [place]  336-  [place] [address] [place] or [phone]    Mobile Crisis Teams Organization         Address  Phone  Notes  [facility], Mobile Crisis Care Unit  [phone]   [facility]  [address] [place], [place]   [place] [address], [place] [phone]    Self-Help/Support Groups Organization         Address  Phone             Notes  [place]. of [place] - variety of support groups  [place] Call for more information  Narcotics Anonymous (NA), [facility] [address], [employer] [place]  2 meetings at this location   Special educational needs teacher         Address  Phone  Notes  ASAP Residential Treatment [place],    [place]  [phone]   [facility]  [address], [place] [id number], [place], [place]   [place], [place] [phone] Admissions: 8am-3pm M-F  Incentives Substance [place] [address]    [place], [place] [id number]   [facility] [address] [place], [place], [place]   [facility] [address],  [place], [place]   Insight Programs - Intensive Outpatient [address] Kristeen [address], [place], [place]   [facility] ([place].) [place].,  [place], [place] [phone] or [phone]   [facility] Services (RTS) [address], [place], [place] Accepts Medicaid  [facility] [place] [address]  [place] [phone] Substance Abuse/Addiction Treatment   [facility] Organization         Address  Phone  Notes  [facility]  [phone]   Domenic Schwab, PhD [address] Arlis Porta [place], [place]   [phone] or [phone]   [place] [address] [place], [place] [phone]   [facility] [address], [place], [place] [phone] Insurance/Medicaid/sponsorship through [facility] and Families [address]                                    [place], [place] [phone] [place] [address], [place] [phone]    Dr. Adele Schilder  [phone]   [facility] Dept. 1) [address] [place] 2) [place] 3)  [place] [address], [place] [phone]  [phone]   [place] [phone] or [phone] (After Hours)

## 2015-01-24 ENCOUNTER — Emergency Department (HOSPITAL_COMMUNITY)
Admission: EM | Admit: 2015-01-24 | Discharge: 2015-01-24 | Disposition: A | Discharge status: Home / Self Care | Payer: Self-pay | Attending: Emergency Medicine | Admitting: Emergency Medicine

## 2015-01-24 ENCOUNTER — Encounter (HOSPITAL_COMMUNITY): Payer: Self-pay | Admitting: Emergency Medicine

## 2015-01-24 ENCOUNTER — Emergency Department (HOSPITAL_COMMUNITY): Payer: Self-pay

## 2015-01-24 DIAGNOSIS — Z8781 Personal history of (healed) traumatic fracture: Secondary | ICD-10-CM | POA: Insufficient documentation

## 2015-01-24 DIAGNOSIS — N289 Disorder of kidney and ureter, unspecified: Secondary | ICD-10-CM | POA: Insufficient documentation

## 2015-01-24 DIAGNOSIS — Z720 Tobacco use: Secondary | ICD-10-CM | POA: Insufficient documentation

## 2015-01-24 DIAGNOSIS — Z8739 Personal history of other diseases of the musculoskeletal system and connective tissue: Secondary | ICD-10-CM | POA: Insufficient documentation

## 2015-01-24 DIAGNOSIS — I10 Essential (primary) hypertension: Secondary | ICD-10-CM | POA: Insufficient documentation

## 2015-01-24 DIAGNOSIS — Z79899 Other long term (current) drug therapy: Secondary | ICD-10-CM | POA: Insufficient documentation

## 2015-01-24 LAB — CBC WITH DIFFERENTIAL/PLATELET
Basophils Absolute: 0 10*3/uL (ref 0.0–0.1)
Basophils Relative: 0 % (ref 0–1)
EOS ABS: 0.2 10*3/uL (ref 0.0–0.7)
Eosinophils Relative: 2 % (ref 0–5)
HCT: 46.1 % (ref 39.0–52.0)
HEMOGLOBIN: 15.8 g/dL (ref 13.0–17.0)
Lymphocytes Relative: 30 % (ref 12–46)
Lymphs Abs: 3.3 10*3/uL (ref 0.7–4.0)
MCH: 30 pg (ref 26.0–34.0)
MCHC: 34.3 g/dL (ref 30.0–36.0)
MCV: 87.6 fL (ref 78.0–100.0)
MONOS PCT: 6 % (ref 3–12)
Monocytes Absolute: 0.6 10*3/uL (ref 0.1–1.0)
NEUTROS PCT: 62 % (ref 43–77)
Neutro Abs: 6.9 10*3/uL (ref 1.7–7.7)
Platelets: 246 10*3/uL (ref 150–400)
RBC: 5.26 MIL/uL (ref 4.22–5.81)
RDW: 14.4 % (ref 11.5–15.5)
WBC: 11.1 10*3/uL — ABNORMAL HIGH (ref 4.0–10.5)

## 2015-01-24 LAB — COMPREHENSIVE METABOLIC PANEL
ALK PHOS: 85 U/L (ref 39–117)
ALT: 23 U/L (ref 0–53)
AST: 20 U/L (ref 0–37)
Albumin: 3.8 g/dL (ref 3.5–5.2)
Anion gap: 5 (ref 5–15)
BUN: 27 mg/dL — ABNORMAL HIGH (ref 6–23)
CHLORIDE: 109 mmol/L (ref 96–112)
CO2: 23 mmol/L (ref 19–32)
Calcium: 8.5 mg/dL (ref 8.4–10.5)
Creatinine, Ser: 3.21 mg/dL — ABNORMAL HIGH (ref 0.50–1.35)
GFR, EST AFRICAN AMERICAN: 26 mL/min — AB (ref >90)
GFR, EST NON AFRICAN AMERICAN: 23 mL/min — AB (ref >90)
GLUCOSE: 93 mg/dL (ref 70–99)
POTASSIUM: 4.1 mmol/L (ref 3.5–5.1)
SODIUM: 137 mmol/L (ref 135–145)
Total Bilirubin: 0.4 mg/dL (ref 0.3–1.2)
Total Protein: 7 g/dL (ref 6.0–8.3)

## 2015-01-24 LAB — TROPONIN I: Troponin I: 0.04 ng/mL — ABNORMAL HIGH (ref <0.031)

## 2015-01-24 MED ORDER — LOSARTAN POTASSIUM 50 MG PO TABS
100.0000 mg | ORAL_TABLET | Freq: Every day | ORAL | Status: DC
  Administered 2015-01-24: 100 mg via ORAL
  Filled 2015-01-24 (×3): qty 2

## 2015-01-24 MED ORDER — AMLODIPINE BESYLATE 5 MG PO TABS
10.0000 mg | ORAL_TABLET | Freq: Once | ORAL | Status: AC
  Administered 2015-01-24: 10 mg via ORAL
  Filled 2015-01-24: qty 2

## 2015-01-24 MED ORDER — AMLODIPINE BESYLATE 10 MG PO TABS: 10.0000 mg | ORAL_TABLET | Freq: Every day | ORAL | Status: DC

## 2015-01-24 MED ORDER — LOSARTAN POTASSIUM 100 MG PO TABS: 100.0000 mg | ORAL_TABLET | Freq: Every day | ORAL | Status: DC

## 2015-01-24 NOTE — ED Notes (Signed)
Physician aware of discharge BP, Importance of BP maintance and seeing a nephrologist stressed by Dr. Lacinda Axon as well as myself. Pt expressed understanding.

## 2015-01-24 NOTE — ED Notes (Signed)
PT c/o hypertension with headache x1 day and not taking hypertension medication x1 month d/t no prescription. PT denies any CP or SOB.

## 2015-01-24 NOTE — ED Provider Notes (Addendum)
CSN: [id number]     Arrival date & time 01/24/15  1424 History   First MD Initiated Contact with Patient 01/24/15 1558     Chief Complaint  Patient presents with  . Hypertension     (Consider location/radiation/quality/duration/timing/severity/associated sxs/prior Treatment) HPI.... Patient has known hypertension and has not been taking his medication for 1-2 months. No prescription. He does have a primary care relationship. Complains of headache but no neurological deficits or stiff neck. No chest pain or dyspnea. Severity is moderate.  He apparently has had a history of renal insufficiency without follow-up.  Past Medical History  Diagnosis Date  . Hypertension   . Dyspnea on exertion   . Hypertension   . Cardiomegaly   . Plantar fasciitis   . Fracture, foot   . Kidney disease     Due to HTN   Past Surgical History  Procedure Laterality Date  . No past surgeries     Family History  Problem Relation Age of Onset  . Hypertension Mother   . Hypertension Father   . Diabetes Father   . Sarcoidosis Sister    History  Substance Use Topics  . Smoking status: Current Every Day Smoker -- 1.00 packs/day  . Smokeless tobacco: Not on file  . Alcohol Use: Yes     Comment: rare    Review of Systems  All other systems reviewed and are negative.     Allergies  Review of patient's allergies indicates no known allergies.  Home Medications   Prior to Admission medications   Medication Sig Start Date End Date Taking? Authorizing Provider  amLODipine (NORVASC) 10 MG tablet Take 1 tablet (10 mg total) by mouth daily. 01/24/15   Nat Christen, MD  losartan (COZAAR) 100 MG tablet Take 1 tablet (100 mg total) by mouth daily. 01/24/15   Nat Christen, MD  metoprolol (LOPRESSOR) 50 MG tablet Take 1 tablet (50 mg total) by mouth 2 (two) times daily. Patient not taking: Reported on 01/24/2015 07/21/14   Orpah Greek, MD   BP 220/140 mmHg  Pulse 82  Temp(Src) 98.9 F (37.2 C) (Oral)   Resp 24  Ht 6\' 5"  (1.956 m)  Wt 260 lb (117.935 kg)  BMI 30.83 kg/m2  SpO2 100% Physical Exam  Constitutional: He is oriented to person, place, and time. He appears well-developed and well-nourished.  Hypertensive  HENT:  Head: Normocephalic and atraumatic.  Eyes: Conjunctivae and EOM are normal. Pupils are equal, round, and reactive to light.  Neck: Normal range of motion. Neck supple.  Cardiovascular: Normal rate and regular rhythm.   Pulmonary/Chest: Effort normal and breath sounds normal.  Abdominal: Soft. Bowel sounds are normal.  Musculoskeletal: Normal range of motion.  Neurological: He is alert and oriented to person, place, and time.  Skin: Skin is warm and dry.  Psychiatric: He has a normal mood and affect. His behavior is normal.  Nursing note and vitals reviewed.   ED Course  Procedures (including critical care time) Labs Review Labs Reviewed  TROPONIN I - Abnormal; Notable for the following:    Troponin I 0.04 (*)    All other components within normal limits  CBC WITH DIFFERENTIAL/PLATELET - Abnormal; Notable for the following:    WBC 11.1 (*)    All other components within normal limits  COMPREHENSIVE METABOLIC PANEL - Abnormal; Notable for the following:    BUN 27 (*)    Creatinine, Ser 3.21 (*)    GFR calc non Af Amer 23 (*)  GFR calc Af Amer 26 (*)    All other components within normal limits    Imaging Review Dg Chest 2 View  01/24/2015   CLINICAL DATA:  39 year old male with hypertension and left upper extremity pain for 2 days. Current history of smoking. Initial encounter.  EXAM: CHEST  2 VIEW  COMPARISON:  07/21/2014.  FINDINGS: Normal cardiac size and mediastinal contours. Visualized tracheal air column is within normal limits. Lung volumes within normal limits. Stable mildly increased interstitial markings. No pneumothorax, pulmonary edema, pleural effusion or confluent pulmonary opacity. No acute osseous abnormality identified.  IMPRESSION: No  acute cardiopulmonary abnormality.   Electronically Signed   By: Genevie Ann M.D.   On: 01/24/2015 15:12     EKG Interpretation None      MDM   Final diagnoses:  Essential hypertension  Renal insufficiency    Patient has known hypertension. He has not been taking his medication. I will restart his Norvasc 10 mg daily and losartan 100 mg daily. Patient encouraged to get primary care follow-up. We also discussed his elevated renal function. I have encouraged him to go to a nephrologist and provided him with the phone number.    Nat Christen, MD 01/24/15 1720  Nat Christen, MD 01/24/15 [phone]

## 2015-01-24 NOTE — Discharge Instructions (Signed)
Restart blood pressure medications.   You'll need primary care follow-up to make sure your blood pressure is acceptable.  Recommend going to [facility] and checking it on their machines and recording your values.  Your creatinine was elevated.  Recommend following up with a kidney specialist or nephrologist. Phone number given.

## 2015-01-24 NOTE — ED Notes (Signed)
After waiting an half hour after the pharmacy wrote that Losartan would be delivered I called them. They will send the medication at this time.

## 2015-06-28 ENCOUNTER — Encounter (HOSPITAL_COMMUNITY): Payer: Self-pay | Admitting: Emergency Medicine

## 2015-06-28 ENCOUNTER — Emergency Department (HOSPITAL_COMMUNITY)
Admission: EM | Admit: 2015-06-28 | Discharge: 2015-06-28 | Disposition: A | Discharge status: Home / Self Care | Payer: Self-pay | Attending: Emergency Medicine | Admitting: Emergency Medicine

## 2015-06-28 ENCOUNTER — Emergency Department (HOSPITAL_COMMUNITY): Payer: Self-pay

## 2015-06-28 DIAGNOSIS — I10 Essential (primary) hypertension: Secondary | ICD-10-CM | POA: Insufficient documentation

## 2015-06-28 DIAGNOSIS — Z720 Tobacco use: Secondary | ICD-10-CM | POA: Insufficient documentation

## 2015-06-28 DIAGNOSIS — S63502A Unspecified sprain of left wrist, initial encounter: Secondary | ICD-10-CM | POA: Insufficient documentation

## 2015-06-28 DIAGNOSIS — W228XXA Striking against or struck by other objects, initial encounter: Secondary | ICD-10-CM | POA: Insufficient documentation

## 2015-06-28 DIAGNOSIS — Y9389 Activity, other specified: Secondary | ICD-10-CM | POA: Insufficient documentation

## 2015-06-28 DIAGNOSIS — Z8739 Personal history of other diseases of the musculoskeletal system and connective tissue: Secondary | ICD-10-CM | POA: Insufficient documentation

## 2015-06-28 DIAGNOSIS — Z79899 Other long term (current) drug therapy: Secondary | ICD-10-CM | POA: Insufficient documentation

## 2015-06-28 DIAGNOSIS — Z8781 Personal history of (healed) traumatic fracture: Secondary | ICD-10-CM | POA: Insufficient documentation

## 2015-06-28 DIAGNOSIS — Y9289 Other specified places as the place of occurrence of the external cause: Secondary | ICD-10-CM | POA: Insufficient documentation

## 2015-06-28 DIAGNOSIS — Y998 Other external cause status: Secondary | ICD-10-CM | POA: Insufficient documentation

## 2015-06-28 DIAGNOSIS — Z87448 Personal history of other diseases of urinary system: Secondary | ICD-10-CM | POA: Insufficient documentation

## 2015-06-28 MED ORDER — NAPROXEN 500 MG PO TABS: 500.0000 mg | ORAL_TABLET | Freq: Two times a day (BID) | ORAL | Status: DC
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
## 2015-06-28 NOTE — Discharge Instructions (Signed)
Wrist Pain A wrist sprain happens when the bands of tissue that hold the wrist joints together (ligament) stretch too much or tear. A wrist strain happens when muscles or bands of tissue that connect muscles to bones (tendons) are stretched or pulled. HOME CARE  Put ice on the injured area.  Put ice in a plastic bag.  Place a towel between your skin and the bag.  Leave the ice on for 15-20 minutes, 03-04 times a day, for the first 2 days.  Raise (elevate) the injured wrist to lessen puffiness (swelling).  Rest the injured wrist for at least 48 hours or as told by your doctor.  Wear a splint, cast, or an elastic wrap as told by your doctor.  Only take medicine as told by your doctor.  Follow up with your doctor as told. This is important. GET HELP RIGHT AWAY IF:   The fingers are puffy, very red, white, or cold and blue.  The fingers lose feeling (numb) or tingle.  The pain gets worse.  It is hard to move the fingers. MAKE SURE YOU:   Understand these instructions.  Will watch your condition.  Will get help right away if you are not doing well or get worse. Document Released: 05/13/2008 Document Revised: 02/17/2012 Document Reviewed: 01/16/2011 [facility] Patient Information 2015 [place], [place]. This information is not intended to replace advice given to you by your health care provider. Make sure you discuss any questions you have with your health care provider.

## 2015-08-22 ENCOUNTER — Encounter (HOSPITAL_COMMUNITY): Payer: Self-pay | Admitting: Emergency Medicine

## 2015-08-22 ENCOUNTER — Emergency Department (HOSPITAL_COMMUNITY)
Admission: EM | Admit: 2015-08-22 | Discharge: 2015-08-22 | Disposition: A | Discharge status: Home / Self Care | Payer: Self-pay | Attending: Emergency Medicine | Admitting: Emergency Medicine

## 2015-08-22 DIAGNOSIS — M10071 Idiopathic gout, right ankle and foot: Secondary | ICD-10-CM | POA: Insufficient documentation

## 2015-08-22 DIAGNOSIS — Z79899 Other long term (current) drug therapy: Secondary | ICD-10-CM | POA: Insufficient documentation

## 2015-08-22 DIAGNOSIS — Z8781 Personal history of (healed) traumatic fracture: Secondary | ICD-10-CM | POA: Insufficient documentation

## 2015-08-22 DIAGNOSIS — Z87448 Personal history of other diseases of urinary system: Secondary | ICD-10-CM | POA: Insufficient documentation

## 2015-08-22 DIAGNOSIS — Z720 Tobacco use: Secondary | ICD-10-CM | POA: Insufficient documentation

## 2015-08-22 DIAGNOSIS — I10 Essential (primary) hypertension: Secondary | ICD-10-CM | POA: Insufficient documentation

## 2015-08-22 MED ORDER — INDOMETHACIN 25 MG PO CAPS: 25.0000 mg | ORAL_CAPSULE | Freq: Three times a day (TID) | ORAL | Status: DC | PRN

## 2015-08-22 MED ORDER — OXYCODONE-ACETAMINOPHEN 5-325 MG PO TABS: 2.0000 | ORAL_TABLET | ORAL | Status: DC | PRN

## 2015-08-22 MED ORDER — OXYCODONE-ACETAMINOPHEN 5-325 MG PO TABS
2.0000 | ORAL_TABLET | ORAL | Status: DC | PRN
Start: 2015-08-22 — End: 2019-03-30

## 2015-08-22 MED ORDER — OXYCODONE-ACETAMINOPHEN 5-325 MG PO TABS
2.0000 | ORAL_TABLET | Freq: Once | ORAL | Status: AC
  Administered 2015-08-22: 2 via ORAL
  Filled 2015-08-22: qty 2

## 2015-08-22 MED ORDER — PREDNISONE 10 MG PO TABS
60.0000 mg | ORAL_TABLET | Freq: Once | ORAL | Status: AC
  Administered 2015-08-22: 60 mg via ORAL
  Filled 2015-08-22 (×2): qty 1

## 2015-08-22 MED ORDER — COLCHICINE 0.6 MG PO TABS
0.6000 mg | ORAL_TABLET | Freq: Once | ORAL | Status: AC
  Administered 2015-08-22: 0.6 mg via ORAL
  Filled 2015-08-22: qty 1

## 2015-08-22 MED ORDER — COLCHICINE 0.6 MG PO TABS: 0.6000 mg | ORAL_TABLET | Freq: Every day | ORAL | Status: DC

## 2015-08-22 NOTE — ED Notes (Signed)
Pain started last night - base of great toe - under ball off foot-- denies any recent injury - dropped a frozen bird on it last year. But no other problems

## 2015-08-22 NOTE — Discharge Instructions (Signed)

## 2015-08-22 NOTE — ED Provider Notes (Signed)
CSN: [id number]     Arrival date & time 08/22/15  1445 History   First MD Initiated Contact with Patient 08/22/15 1525     Chief Complaint  Patient presents with  . Foot Pain      HPI Pt  presents for evaluation of pain and swelling in his right great toe. No trauma. Started last night. Couldn't sleep limping today. No previous episodes. States a friend told him, "it might be gout".  Past Medical History  Diagnosis Date  . Hypertension   . Dyspnea on exertion   . Hypertension   . Cardiomegaly   . Plantar fasciitis   . Fracture, foot   . Kidney disease     Due to HTN   Past Surgical History  Procedure Laterality Date  . No past surgeries     Family History  Problem Relation Age of Onset  . Hypertension Mother   . Hypertension Father   . Diabetes Father   . Sarcoidosis Sister    Social History  Substance Use Topics  . Smoking status: Current Every Day Smoker -- 1.00 packs/day    Types: Cigarettes  . Smokeless tobacco: None  . Alcohol Use: Yes     Comment: rare    Review of Systems  Constitutional: Negative for fever, chills, diaphoresis, appetite change and fatigue.  HENT: Negative for mouth sores, sore throat and trouble swallowing.   Eyes: Negative for visual disturbance.  Respiratory: Negative for cough, chest tightness, shortness of breath and wheezing.   Cardiovascular: Negative for chest pain.  Gastrointestinal: Negative for nausea, vomiting, abdominal pain, diarrhea and abdominal distention.  Endocrine: Negative for polydipsia, polyphagia and polyuria.  Genitourinary: Negative for dysuria, frequency and hematuria.  Musculoskeletal: Negative for gait problem.       Right great toe pain. No trauma.  Skin: Negative for color change, pallor and rash.  Neurological: Negative for dizziness, syncope, light-headedness and headaches.  Hematological: Does not bruise/bleed easily.  Psychiatric/Behavioral: Negative for behavioral problems and confusion.       Allergies  Review of patient's allergies indicates no known allergies.  Home Medications   Prior to Admission medications   Medication Sig Start Date End Date Taking? Authorizing Provider  diphenhydrAMINE (BENADRYL) 25 MG tablet Take 25 mg by mouth every 6 (six) hours as needed for allergies.   Yes Historical Provider, MD  amLODipine (NORVASC) 10 MG tablet Take 1 tablet (10 mg total) by mouth daily. Patient not taking: Reported on 08/22/2015 01/24/15   Nat Christen, MD  colchicine 0.6 MG tablet Take 1 tablet (0.6 mg total) by mouth daily. 08/22/15   Tanna Furry, MD  indomethacin (INDOCIN) 25 MG capsule Take 1 capsule (25 mg total) by mouth 3 (three) times daily as needed. 08/22/15   Tanna Furry, MD  losartan (COZAAR) 100 MG tablet Take 1 tablet (100 mg total) by mouth daily. Patient not taking: Reported on 08/22/2015 01/24/15   Nat Christen, MD  naproxen (NAPROSYN) 500 MG tablet Take 1 tablet (500 mg total) by mouth 2 (two) times daily with a meal. Patient not taking: Reported on 08/22/2015 06/28/15   Tammy Triplett, PA-C  oxyCODONE-acetaminophen (PERCOCET/ROXICET) 5-325 MG per tablet Take 2 tablets by mouth every 4 (four) hours as needed. 08/22/15   Tanna Furry, MD   BP 214/123 mmHg  Pulse 94  Temp(Src) 98.1 F (36.7 C) (Oral)  Resp 18  Ht 6\' 4"  (1.93 m)  Wt 246 lb (111.585 kg)  BMI 29.96 kg/m2  SpO2 97% Physical Exam  Constitutional: He is oriented to person, place, and time. He appears well-developed and well-nourished. No distress.  HENT:  Head: Normocephalic.  Eyes: Conjunctivae are normal. Pupils are equal, round, and reactive to light. No scleral icterus.  Neck: Normal range of motion. Neck supple. No thyromegaly present.  Cardiovascular: Normal rate and regular rhythm.  Exam reveals no gallop and no friction rub.   No murmur heard. Pulmonary/Chest: Effort normal and breath sounds normal. No respiratory distress. He has no wheezes. He has no rales.  Abdominal: Soft. Bowel sounds  are normal. He exhibits no distension. There is no tenderness. There is no rebound.  Musculoskeletal: Normal range of motion.       Feet:  Neurological: He is alert and oriented to person, place, and time.  Skin: Skin is warm and dry. No rash noted.  Psychiatric: He has a normal mood and affect. His behavior is normal.    ED Course  Procedures (including critical care time) Labs Review Labs Reviewed - No data to display  Imaging Review No results found. I have personally reviewed and evaluated these images and lab results as part of my medical decision-making.   EKG Interpretation None      MDM   Final diagnoses:  Acute idiopathic gout of right foot        Tanna Furry, MD 08/22/15 [phone]

## 2015-08-22 NOTE — ED Notes (Signed)
EDp aware pf bp, instructed to take bp medication when he gets home.

## 2015-08-22 NOTE — ED Notes (Signed)
Great toe pain. No injury. No wounds noted.

## 2018-02-17 DIAGNOSIS — I10 Essential (primary) hypertension: Secondary | ICD-10-CM | POA: Diagnosis not present

## 2018-02-17 DIAGNOSIS — N184 Chronic kidney disease, stage 4 (severe): Secondary | ICD-10-CM | POA: Diagnosis not present

## 2018-02-17 DIAGNOSIS — D631 Anemia in chronic kidney disease: Secondary | ICD-10-CM | POA: Diagnosis not present

## 2018-02-17 DIAGNOSIS — M1009 Idiopathic gout, multiple sites: Secondary | ICD-10-CM | POA: Diagnosis not present

## 2018-02-17 DIAGNOSIS — Z6827 Body mass index (BMI) 27.0-27.9, adult: Secondary | ICD-10-CM | POA: Diagnosis not present

## 2018-02-27 DIAGNOSIS — D519 Vitamin B12 deficiency anemia, unspecified: Secondary | ICD-10-CM | POA: Diagnosis not present

## 2018-02-27 DIAGNOSIS — D509 Iron deficiency anemia, unspecified: Secondary | ICD-10-CM | POA: Diagnosis not present

## 2018-02-27 DIAGNOSIS — I10 Essential (primary) hypertension: Secondary | ICD-10-CM | POA: Diagnosis not present

## 2018-02-27 DIAGNOSIS — N183 Chronic kidney disease, stage 3 (moderate): Secondary | ICD-10-CM | POA: Diagnosis not present

## 2018-02-27 DIAGNOSIS — E559 Vitamin D deficiency, unspecified: Secondary | ICD-10-CM | POA: Diagnosis not present

## 2018-02-27 DIAGNOSIS — Z79899 Other long term (current) drug therapy: Secondary | ICD-10-CM | POA: Diagnosis not present

## 2018-03-09 DIAGNOSIS — R809 Proteinuria, unspecified: Secondary | ICD-10-CM | POA: Diagnosis not present

## 2018-03-09 DIAGNOSIS — D649 Anemia, unspecified: Secondary | ICD-10-CM | POA: Diagnosis not present

## 2018-03-09 DIAGNOSIS — I10 Essential (primary) hypertension: Secondary | ICD-10-CM | POA: Diagnosis not present

## 2018-03-09 DIAGNOSIS — N185 Chronic kidney disease, stage 5: Secondary | ICD-10-CM | POA: Diagnosis not present

## 2018-04-17 DIAGNOSIS — I10 Essential (primary) hypertension: Secondary | ICD-10-CM | POA: Diagnosis not present

## 2018-04-17 DIAGNOSIS — Z79899 Other long term (current) drug therapy: Secondary | ICD-10-CM | POA: Diagnosis not present

## 2018-04-17 DIAGNOSIS — E559 Vitamin D deficiency, unspecified: Secondary | ICD-10-CM | POA: Diagnosis not present

## 2018-04-17 DIAGNOSIS — N184 Chronic kidney disease, stage 4 (severe): Secondary | ICD-10-CM | POA: Diagnosis not present

## 2018-04-17 DIAGNOSIS — D509 Iron deficiency anemia, unspecified: Secondary | ICD-10-CM | POA: Diagnosis not present

## 2018-04-17 DIAGNOSIS — R809 Proteinuria, unspecified: Secondary | ICD-10-CM | POA: Diagnosis not present

## 2018-04-21 DIAGNOSIS — N184 Chronic kidney disease, stage 4 (severe): Secondary | ICD-10-CM | POA: Diagnosis not present

## 2018-04-21 DIAGNOSIS — R809 Proteinuria, unspecified: Secondary | ICD-10-CM | POA: Diagnosis not present

## 2018-04-21 DIAGNOSIS — I10 Essential (primary) hypertension: Secondary | ICD-10-CM | POA: Diagnosis not present

## 2018-04-21 DIAGNOSIS — E872 Acidosis: Secondary | ICD-10-CM | POA: Diagnosis not present

## 2018-07-16 DIAGNOSIS — M25561 Pain in right knee: Secondary | ICD-10-CM | POA: Diagnosis not present

## 2018-11-13 DIAGNOSIS — Z6826 Body mass index (BMI) 26.0-26.9, adult: Secondary | ICD-10-CM | POA: Diagnosis not present

## 2018-11-13 DIAGNOSIS — I161 Hypertensive emergency: Secondary | ICD-10-CM | POA: Diagnosis not present

## 2018-11-13 DIAGNOSIS — N184 Chronic kidney disease, stage 4 (severe): Secondary | ICD-10-CM | POA: Diagnosis not present

## 2018-11-13 DIAGNOSIS — M1009 Idiopathic gout, multiple sites: Secondary | ICD-10-CM | POA: Diagnosis not present

## 2018-11-19 DIAGNOSIS — Z6827 Body mass index (BMI) 27.0-27.9, adult: Secondary | ICD-10-CM | POA: Diagnosis not present

## 2018-11-19 DIAGNOSIS — Z0000 Encounter for general adult medical examination without abnormal findings: Secondary | ICD-10-CM | POA: Diagnosis not present

## 2018-12-11 DIAGNOSIS — Z79899 Other long term (current) drug therapy: Secondary | ICD-10-CM | POA: Diagnosis not present

## 2018-12-11 DIAGNOSIS — I10 Essential (primary) hypertension: Secondary | ICD-10-CM | POA: Diagnosis not present

## 2018-12-11 DIAGNOSIS — R809 Proteinuria, unspecified: Secondary | ICD-10-CM | POA: Diagnosis not present

## 2018-12-11 DIAGNOSIS — N183 Chronic kidney disease, stage 3 (moderate): Secondary | ICD-10-CM | POA: Diagnosis not present

## 2018-12-11 DIAGNOSIS — D509 Iron deficiency anemia, unspecified: Secondary | ICD-10-CM | POA: Diagnosis not present

## 2018-12-11 DIAGNOSIS — E559 Vitamin D deficiency, unspecified: Secondary | ICD-10-CM | POA: Diagnosis not present

## 2018-12-18 ENCOUNTER — Other Ambulatory Visit: Payer: Self-pay

## 2018-12-18 DIAGNOSIS — N185 Chronic kidney disease, stage 5: Secondary | ICD-10-CM

## 2018-12-24 DIAGNOSIS — F17200 Nicotine dependence, unspecified, uncomplicated: Secondary | ICD-10-CM | POA: Diagnosis not present

## 2018-12-24 DIAGNOSIS — I10 Essential (primary) hypertension: Secondary | ICD-10-CM | POA: Diagnosis not present

## 2018-12-24 DIAGNOSIS — Z79899 Other long term (current) drug therapy: Secondary | ICD-10-CM | POA: Diagnosis not present

## 2018-12-24 DIAGNOSIS — M79671 Pain in right foot: Secondary | ICD-10-CM | POA: Diagnosis not present

## 2018-12-24 DIAGNOSIS — M109 Gout, unspecified: Secondary | ICD-10-CM | POA: Diagnosis not present

## 2018-12-28 ENCOUNTER — Encounter: Payer: BLUE CROSS/BLUE SHIELD | Admitting: Vascular Surgery

## 2018-12-28 ENCOUNTER — Ambulatory Visit (HOSPITAL_COMMUNITY): Payer: BLUE CROSS/BLUE SHIELD

## 2019-02-05 DIAGNOSIS — E559 Vitamin D deficiency, unspecified: Secondary | ICD-10-CM | POA: Diagnosis not present

## 2019-02-05 DIAGNOSIS — Z79899 Other long term (current) drug therapy: Secondary | ICD-10-CM | POA: Diagnosis not present

## 2019-02-05 DIAGNOSIS — R809 Proteinuria, unspecified: Secondary | ICD-10-CM | POA: Diagnosis not present

## 2019-02-05 DIAGNOSIS — I10 Essential (primary) hypertension: Secondary | ICD-10-CM | POA: Diagnosis not present

## 2019-02-05 DIAGNOSIS — N183 Chronic kidney disease, stage 3 (moderate): Secondary | ICD-10-CM | POA: Diagnosis not present

## 2019-02-09 DIAGNOSIS — I10 Essential (primary) hypertension: Secondary | ICD-10-CM | POA: Diagnosis not present

## 2019-02-09 DIAGNOSIS — Z6826 Body mass index (BMI) 26.0-26.9, adult: Secondary | ICD-10-CM | POA: Diagnosis not present

## 2019-02-09 DIAGNOSIS — N184 Chronic kidney disease, stage 4 (severe): Secondary | ICD-10-CM | POA: Diagnosis not present

## 2019-03-18 ENCOUNTER — Encounter: Payer: Self-pay | Admitting: Internal Medicine

## 2019-03-18 DIAGNOSIS — R1084 Generalized abdominal pain: Secondary | ICD-10-CM | POA: Diagnosis not present

## 2019-03-18 DIAGNOSIS — R1010 Upper abdominal pain, unspecified: Secondary | ICD-10-CM | POA: Diagnosis not present

## 2019-03-30 ENCOUNTER — Ambulatory Visit (INDEPENDENT_AMBULATORY_CARE_PROVIDER_SITE_OTHER): Payer: BLUE CROSS/BLUE SHIELD | Admitting: Internal Medicine

## 2019-03-30 ENCOUNTER — Other Ambulatory Visit: Payer: Self-pay

## 2019-03-30 ENCOUNTER — Encounter (INDEPENDENT_AMBULATORY_CARE_PROVIDER_SITE_OTHER): Payer: Self-pay | Admitting: Internal Medicine

## 2019-03-30 VITALS — BP 224/109 | HR 62 | Temp 98.2°F | Ht 77.0 in | Wt 211.9 lb

## 2019-03-30 DIAGNOSIS — R1310 Dysphagia, unspecified: Secondary | ICD-10-CM

## 2019-03-30 DIAGNOSIS — R1010 Upper abdominal pain, unspecified: Secondary | ICD-10-CM | POA: Diagnosis not present

## 2019-03-30 DIAGNOSIS — R1319 Other dysphagia: Secondary | ICD-10-CM

## 2019-03-30 NOTE — Patient Instructions (Signed)
Labs and DG esophagram.

## 2019-03-30 NOTE — Progress Notes (Addendum)
Subjective:    Patient ID: Isaiah Ramos, male    DOB: Feb 07, 1976, 43 y.o.   MRN: [id number]  HPI Referred by Dr. Sherrie Sport for abdominal pain. When he becomes excited, he has pain across his abdominal pain. He says it hurts during intercourse. The pain comes and goes. Sometimes he will have the pain when he is relaxing. He describes as across his upper abdomen. Symptoms for about 6 months. He tells me sometimes his foods feel like they "get stuck" in his lower esophagus.  Denies any heart burn.  His appetite is good. No weight loss.  Has a BM daily.   03/18/2019 S abdomen complete: upper abdominal pain x 1 month. Collapsed GB with mild nonspecific wall thickening. Negative for cholelithiasis or a cute cholecystitis. No biliary dilatation or obstruction. No other acute findings or obstruction.  CBC 3.37mm.   CKD stage 3-4  Review of Systems Past Medical History:  Diagnosis Date  . Cardiomegaly   . Dyspnea on exertion   . Fracture, foot   . Hypertension   . Hypertension   . Kidney disease    Due to HTN  . Plantar fasciitis     Past Surgical History:  Procedure Laterality Date  . HERNIA REPAIR    . NO PAST SURGERIES    . PYLOROPLASTY      No Known Allergies  Current Outpatient Medications on File Prior to Visit  Medication Sig Dispense Refill  . bisoprolol-hydrochlorothiazide (ZIAC) 10-6.25 MG tablet Take 1 tablet by mouth daily.    . calcitRIOL (ROCALTROL) 0.5 MCG capsule Take 0.5 mcg by mouth daily.    . calcium acetate (PHOSLO) 667 MG capsule Take 667 mg by mouth 3 (three) times daily with meals.    . calcium carbonate (OSCAL) 1500 (600 Ca) MG TABS tablet Take by mouth 2 (two) times daily with a meal.    . calcium-vitamin D (OSCAL WITH D) 500-200 MG-UNIT tablet Take 1 tablet by mouth.    . Cholecalciferol (VITAMIN D3) 125 MCG (5000 UT) CAPS Take 2,000 Units by mouth daily.    . cloNIDine (CATAPRES) 0.2 MG tablet Take 0.2 mg by mouth 2 (two) times daily.    . furosemide (LASIX)  20 MG tablet Take 20 mg by mouth.    . hydrALAZINE (APRESOLINE) 50 MG tablet Take 50 mg by mouth 3 (three) times daily.    Marland Kitchen NIFEdipine (ADALAT CC) 90 MG 24 hr tablet Take 90 mg by mouth daily.    . sodium bicarbonate 650 MG tablet Take 650 mg by mouth 3 (three) times daily.    . [DISCONTINUED] metoprolol (LOPRESSOR) 50 MG tablet Take 1 tablet (50 mg total) by mouth 2 (two) times daily. (Patient not taking: Reported on 01/24/2015) 60 tablet 0   No current facility-administered medications on file prior to visit.         Objective:   Physical Exam Blood pressure (!) 224/109, pulse 62, temperature 98.2 F (36.8 C), height 6\' 5"  (1.956 m), weight 211 lb 14.4 oz (96.1 kg). Alert and oriented. Skin warm and dry. Oral mucosa is moist.   . Sclera anicteric, conjunctivae is pink. Thyroid not enlarged. No cervical lymphadenopathy. Lungs clear. Heart regular rate and rhythm.  Abdomen is soft. Bowel sounds are positive. No hepatomegaly. No abdominal masses felt. No tenderness.  No edema to lower extremities.          Assessment & Plan:  Upper abdominal pain. Dysphagia. Am going to get an Esophagram. CBC and  CMET.  Further recommendations to follow.

## 2019-04-05 ENCOUNTER — Encounter (INDEPENDENT_AMBULATORY_CARE_PROVIDER_SITE_OTHER): Payer: Self-pay | Admitting: Internal Medicine

## 2019-04-07 ENCOUNTER — Ambulatory Visit (HOSPITAL_COMMUNITY): Payer: BLUE CROSS/BLUE SHIELD

## 2019-04-07 ENCOUNTER — Other Ambulatory Visit (INDEPENDENT_AMBULATORY_CARE_PROVIDER_SITE_OTHER): Payer: Self-pay | Admitting: Internal Medicine

## 2019-04-07 ENCOUNTER — Ambulatory Visit (HOSPITAL_COMMUNITY)
Admission: RE | Admit: 2019-04-07 | Discharge: 2019-04-07 | Disposition: A | Discharge status: Home / Self Care | Payer: BLUE CROSS/BLUE SHIELD | Source: Ambulatory Visit | Attending: Internal Medicine | Admitting: Internal Medicine

## 2019-04-07 ENCOUNTER — Other Ambulatory Visit: Payer: Self-pay

## 2019-04-07 DIAGNOSIS — R1319 Other dysphagia: Secondary | ICD-10-CM

## 2019-04-07 DIAGNOSIS — R1310 Dysphagia, unspecified: Secondary | ICD-10-CM | POA: Diagnosis not present

## 2019-04-07 DIAGNOSIS — R1010 Upper abdominal pain, unspecified: Secondary | ICD-10-CM | POA: Diagnosis not present

## 2019-04-28 ENCOUNTER — Telehealth (INDEPENDENT_AMBULATORY_CARE_PROVIDER_SITE_OTHER): Payer: Self-pay | Admitting: Internal Medicine

## 2019-04-28 DIAGNOSIS — K219 Gastro-esophageal reflux disease without esophagitis: Secondary | ICD-10-CM

## 2019-04-28 MED ORDER — PANTOPRAZOLE SODIUM 40 MG PO TBEC: 40.0000 mg | DELAYED_RELEASE_TABLET | Freq: Every day | ORAL | 3 refills | Status: DC

## 2019-04-28 NOTE — Telephone Encounter (Signed)
Rx sent to his pharmacy

## 2019-05-18 DIAGNOSIS — N184 Chronic kidney disease, stage 4 (severe): Secondary | ICD-10-CM | POA: Diagnosis not present

## 2019-05-18 DIAGNOSIS — I10 Essential (primary) hypertension: Secondary | ICD-10-CM | POA: Diagnosis not present

## 2019-05-18 DIAGNOSIS — D1722 Benign lipomatous neoplasm of skin and subcutaneous tissue of left arm: Secondary | ICD-10-CM | POA: Diagnosis not present

## 2019-05-18 DIAGNOSIS — Z6825 Body mass index (BMI) 25.0-25.9, adult: Secondary | ICD-10-CM | POA: Diagnosis not present

## 2019-06-17 DIAGNOSIS — N185 Chronic kidney disease, stage 5: Secondary | ICD-10-CM | POA: Diagnosis not present

## 2019-06-17 DIAGNOSIS — Z79899 Other long term (current) drug therapy: Secondary | ICD-10-CM | POA: Diagnosis not present

## 2019-06-17 DIAGNOSIS — E559 Vitamin D deficiency, unspecified: Secondary | ICD-10-CM | POA: Diagnosis not present

## 2019-06-17 DIAGNOSIS — D509 Iron deficiency anemia, unspecified: Secondary | ICD-10-CM | POA: Diagnosis not present

## 2019-06-17 DIAGNOSIS — I10 Essential (primary) hypertension: Secondary | ICD-10-CM | POA: Diagnosis not present

## 2019-10-18 DIAGNOSIS — N184 Chronic kidney disease, stage 4 (severe): Secondary | ICD-10-CM | POA: Diagnosis not present

## 2019-10-18 DIAGNOSIS — M109 Gout, unspecified: Secondary | ICD-10-CM | POA: Diagnosis not present

## 2019-10-18 DIAGNOSIS — M79671 Pain in right foot: Secondary | ICD-10-CM | POA: Diagnosis not present

## 2019-10-18 DIAGNOSIS — Z79899 Other long term (current) drug therapy: Secondary | ICD-10-CM | POA: Diagnosis not present

## 2019-10-18 DIAGNOSIS — I129 Hypertensive chronic kidney disease with stage 1 through stage 4 chronic kidney disease, or unspecified chronic kidney disease: Secondary | ICD-10-CM | POA: Diagnosis not present

## 2019-10-18 DIAGNOSIS — Z5329 Procedure and treatment not carried out because of patient's decision for other reasons: Secondary | ICD-10-CM | POA: Diagnosis not present

## 2019-10-18 DIAGNOSIS — F17200 Nicotine dependence, unspecified, uncomplicated: Secondary | ICD-10-CM | POA: Diagnosis not present

## 2019-10-19 DIAGNOSIS — E7889 Other lipoprotein metabolism disorders: Secondary | ICD-10-CM | POA: Insufficient documentation

## 2019-10-19 DIAGNOSIS — R809 Proteinuria, unspecified: Secondary | ICD-10-CM | POA: Insufficient documentation

## 2019-10-19 DIAGNOSIS — E875 Hyperkalemia: Secondary | ICD-10-CM | POA: Insufficient documentation

## 2019-10-19 DIAGNOSIS — N185 Chronic kidney disease, stage 5: Secondary | ICD-10-CM | POA: Insufficient documentation

## 2019-11-05 DIAGNOSIS — N189 Chronic kidney disease, unspecified: Secondary | ICD-10-CM | POA: Diagnosis not present

## 2019-11-05 DIAGNOSIS — Z9114 Patient's other noncompliance with medication regimen: Secondary | ICD-10-CM | POA: Diagnosis not present

## 2019-11-05 DIAGNOSIS — M25562 Pain in left knee: Secondary | ICD-10-CM | POA: Diagnosis not present

## 2019-11-05 DIAGNOSIS — M79605 Pain in left leg: Secondary | ICD-10-CM | POA: Diagnosis not present

## 2019-11-05 DIAGNOSIS — Z79899 Other long term (current) drug therapy: Secondary | ICD-10-CM | POA: Diagnosis not present

## 2019-11-05 DIAGNOSIS — I129 Hypertensive chronic kidney disease with stage 1 through stage 4 chronic kidney disease, or unspecified chronic kidney disease: Secondary | ICD-10-CM | POA: Diagnosis not present

## 2019-11-05 DIAGNOSIS — F17200 Nicotine dependence, unspecified, uncomplicated: Secondary | ICD-10-CM | POA: Diagnosis not present

## 2019-11-09 DIAGNOSIS — N184 Chronic kidney disease, stage 4 (severe): Secondary | ICD-10-CM | POA: Diagnosis not present

## 2019-11-09 DIAGNOSIS — E559 Vitamin D deficiency, unspecified: Secondary | ICD-10-CM | POA: Diagnosis not present

## 2019-11-09 DIAGNOSIS — Z79899 Other long term (current) drug therapy: Secondary | ICD-10-CM | POA: Diagnosis not present

## 2019-11-09 DIAGNOSIS — I10 Essential (primary) hypertension: Secondary | ICD-10-CM | POA: Diagnosis not present

## 2019-11-09 DIAGNOSIS — D631 Anemia in chronic kidney disease: Secondary | ICD-10-CM | POA: Diagnosis not present

## 2019-11-10 DIAGNOSIS — R809 Proteinuria, unspecified: Secondary | ICD-10-CM | POA: Diagnosis not present

## 2019-11-10 DIAGNOSIS — E211 Secondary hyperparathyroidism, not elsewhere classified: Secondary | ICD-10-CM | POA: Diagnosis not present

## 2019-11-10 DIAGNOSIS — E875 Hyperkalemia: Secondary | ICD-10-CM | POA: Diagnosis not present

## 2019-11-10 DIAGNOSIS — N185 Chronic kidney disease, stage 5: Secondary | ICD-10-CM | POA: Diagnosis not present

## 2019-11-24 DIAGNOSIS — R809 Proteinuria, unspecified: Secondary | ICD-10-CM | POA: Diagnosis not present

## 2019-11-24 DIAGNOSIS — N185 Chronic kidney disease, stage 5: Secondary | ICD-10-CM | POA: Diagnosis not present

## 2019-11-24 DIAGNOSIS — E211 Secondary hyperparathyroidism, not elsewhere classified: Secondary | ICD-10-CM | POA: Diagnosis not present

## 2019-11-24 DIAGNOSIS — E875 Hyperkalemia: Secondary | ICD-10-CM | POA: Diagnosis not present

## 2019-12-01 ENCOUNTER — Other Ambulatory Visit: Payer: Self-pay

## 2019-12-01 DIAGNOSIS — N185 Chronic kidney disease, stage 5: Secondary | ICD-10-CM

## 2019-12-08 ENCOUNTER — Encounter (HOSPITAL_COMMUNITY): Payer: BLUE CROSS/BLUE SHIELD

## 2019-12-08 ENCOUNTER — Encounter: Payer: BLUE CROSS/BLUE SHIELD | Admitting: Vascular Surgery

## 2019-12-08 ENCOUNTER — Other Ambulatory Visit (HOSPITAL_COMMUNITY): Payer: BLUE CROSS/BLUE SHIELD

## 2020-01-27 ENCOUNTER — Other Ambulatory Visit (HOSPITAL_COMMUNITY): Payer: Self-pay

## 2020-01-27 ENCOUNTER — Encounter (HOSPITAL_COMMUNITY): Payer: BC Managed Care – PPO

## 2020-01-27 ENCOUNTER — Encounter: Payer: Self-pay | Admitting: Vascular Surgery

## 2020-01-31 ENCOUNTER — Encounter: Payer: Self-pay | Admitting: Surgery

## 2020-01-31 ENCOUNTER — Ambulatory Visit (INDEPENDENT_AMBULATORY_CARE_PROVIDER_SITE_OTHER)
Admission: RE | Admit: 2020-01-31 | Discharge: 2020-01-31 | Disposition: A | Discharge status: Home / Self Care | Payer: 59 | Source: Ambulatory Visit | Attending: Vascular Surgery | Admitting: Vascular Surgery

## 2020-01-31 ENCOUNTER — Ambulatory Visit (INDEPENDENT_AMBULATORY_CARE_PROVIDER_SITE_OTHER): Payer: Self-pay | Admitting: Surgery

## 2020-01-31 ENCOUNTER — Other Ambulatory Visit: Payer: Self-pay

## 2020-01-31 ENCOUNTER — Ambulatory Visit (HOSPITAL_COMMUNITY)
Admission: RE | Admit: 2020-01-31 | Discharge: 2020-01-31 | Disposition: A | Discharge status: Home / Self Care | Payer: 59 | Source: Ambulatory Visit | Attending: Vascular Surgery | Admitting: Vascular Surgery

## 2020-01-31 VITALS — BP 126/73 | HR 78 | Temp 98.3°F | Resp 20 | Ht 77.0 in | Wt 202.0 lb

## 2020-01-31 DIAGNOSIS — N185 Chronic kidney disease, stage 5: Secondary | ICD-10-CM

## 2020-01-31 NOTE — Progress Notes (Signed)
[facility] [place]  Patient name: Isaiah Ramos MRN: [id number] DOB: [birth year] Sex: male   REQUESTING PROVIDER:    Dr. Theador Hawthorne   REASON FOR CONSULT:    Dialysis access  HISTORY OF PRESENT ILLNESS:   Isaiah Ramos is a 44 y.o. male, who is referred for dialysis access.  The patient's renal failure secondary to hypertension.  He is CKD 5.  He is right-handed.  His most recent creatinine was 6.0.  He also suffers from anemia related to renal insufficiency as well as secondary hyperparathyroidism.  PAST MEDICAL HISTORY    Past Medical History:  Diagnosis Date  . Cardiomegaly   . Dyspnea on exertion   . Fracture, foot   . Hyperlipidemia   . Hypertension   . Hypertension   . Kidney disease    Due to HTN  . Plantar fasciitis      FAMILY HISTORY   Family History  Problem Relation Age of Onset  . Hypertension Father   . Diabetes Father   . Hypertension Mother   . Sarcoidosis Sister     SOCIAL HISTORY:   Social History   Socioeconomic History  . Marital status: Married    Spouse name: Not on file  . Number of children: Not on file  . Years of education: Not on file  . Highest education level: Not on file  Occupational History  . Occupation: Merchandiser, retail: UNEMPLOYED  Tobacco Use  . Smoking status: Current Every Day Smoker    Packs/day: 0.50    Types: Cigarettes  . Smokeless tobacco: Never Used  Substance and Sexual Activity  . Alcohol use: Yes    Comment: rare  . Drug use: Yes    Types: Marijuana  . Sexual activity: Not on file  Other Topics Concern  . Not on file  Social History Narrative   Domestic partner   Social Determinants of Health   Financial Resource Strain:   . Difficulty of Paying Living Expenses: Not on file  Food Insecurity:   . Worried About Charity fundraiser in the Last Year: Not on file  . Ran Out of Food in the Last Year: Not on file  Transportation Needs:    . Lack of Transportation (Medical): Not on file  . Lack of Transportation (Non-Medical): Not on file  Physical Activity:   . Days of Exercise per Week: Not on file  . Minutes of Exercise per Session: Not on file  Stress:   . Feeling of Stress : Not on file  Social Connections:   . Frequency of Communication with Friends and Family: Not on file  . Frequency of Social Gatherings with Friends and Family: Not on file  . Attends Religious Services: Not on file  . Active Member of Clubs or Organizations: Not on file  . Attends Archivist Meetings: Not on file  . Marital Status: Not on file  Intimate Partner Violence:   . Fear of Current or Ex-Partner: Not on file  . Emotionally Abused: Not on file  . Physically Abused: Not on file  . Sexually Abused: Not on file    ALLERGIES:    No Known Allergies  CURRENT MEDICATIONS:    Current Outpatient Medications  Medication Sig Dispense Refill  . allopurinol (ZYLOPRIM) 100 MG tablet Take 100 mg by mouth daily.    . bisoprolol (ZEBETA) 5 MG tablet Take by mouth.    . calcitRIOL (ROCALTROL) 0.5 MCG capsule Take  0.5 mcg by mouth daily.    . calcium acetate (PHOSLO) 667 MG capsule Take 667 mg by mouth 3 (three) times daily with meals.    . calcium-vitamin D (OSCAL WITH D) 500-200 MG-UNIT tablet Take 1 tablet by mouth.    . Cholecalciferol (VITAMIN D3) 125 MCG (5000 UT) CAPS Take 2,000 Units by mouth daily.    . cloNIDine (CATAPRES) 0.2 MG tablet Take 0.2 mg by mouth 2 (two) times daily.    . furosemide (LASIX) 20 MG tablet Take 20 mg by mouth.    . hydrALAZINE (APRESOLINE) 50 MG tablet Take 50 mg by mouth 3 (three) times daily.    Marland Kitchen MITIGARE 0.6 MG CAPS Take 1 capsule by mouth 3 (three) times daily.    Marland Kitchen NIFEdipine (ADALAT CC) 90 MG 24 hr tablet Take 90 mg by mouth daily.    . predniSONE (DELTASONE) 20 MG tablet Take 40 mg by mouth daily.    . sodium bicarbonate 650 MG tablet Take 650 mg by mouth 3 (three) times daily.    .  bisoprolol-hydrochlorothiazide (ZIAC) 10-6.25 MG tablet Take 1 tablet by mouth daily.    Marland Kitchen omeprazole (PRILOSEC) 20 MG capsule Take 20 mg by mouth daily.     No current facility-administered medications for this visit.    REVIEW OF SYSTEMS:   [X]  denotes positive finding, [ ]  denotes negative finding Cardiac  Comments:  Chest pain or chest pressure: x   Shortness of breath upon exertion: x   Short of breath when lying flat:    Irregular heart rhythm: x       Vascular    Pain in calf, thigh, or hip brought on by ambulation:    Pain in feet at night that wakes you up from your sleep:     Blood clot in your veins:    Leg swelling:  x       Pulmonary    Oxygen at home:    Productive cough:     Wheezing:         Neurologic    Sudden weakness in arms or legs:  x   Sudden numbness in arms or legs:     Sudden onset of difficulty speaking or slurred speech:    Temporary loss of vision in one eye:     Problems with dizziness:         Gastrointestinal    Blood in stool:      Vomited blood:         Genitourinary    Burning when urinating:     Blood in urine:        Psychiatric    Major depression:         Hematologic    Bleeding problems:    Problems with blood clotting too easily:        Skin    Rashes or ulcers:        Constitutional    Fever or chills:     PHYSICAL EXAM:   Vitals:   01/31/20 1129  BP: 126/73  Pulse: 78  Resp: 20  Temp: 98.3 F (36.8 C)  SpO2: 100%  Weight: 202 lb (91.6 kg)  Height: 6\' 5"  (1.956 m)    GENERAL: The patient is a well-nourished male, in no acute distress. The vital signs are documented above. CARDIAC: There is a regular rate and rhythm.  VASCULAR: Palpable radial pulses PULMONARY: Nonlabored respirations MUSCULOSKELETAL: There are no major deformities or cyanosis. NEUROLOGIC: No focal  weakness or paresthesias are detected. SKIN: There are no ulcers or rashes noted. PSYCHIATRIC: The patient has a normal affect.   STUDIES:   I have reviewed the following:  +-----------------+-------------+----------+---------+  Right Cephalic  Diameter (cm)Depth (cm)Findings   +-----------------+-------------+----------+---------+  Shoulder       0.36               +-----------------+-------------+----------+---------+  Prox upper arm    0.28               +-----------------+-------------+----------+---------+  Mid upper arm    0.33               +-----------------+-------------+----------+---------+  Dist upper arm    0.25        branching  +-----------------+-------------+----------+---------+  Antecubital fossa  0.31               +-----------------+-------------+----------+---------+  Prox forearm     0.29               +-----------------+-------------+----------+---------+  Mid forearm     0.23               +-----------------+-------------+----------+---------+  Dist forearm     0.24               +-----------------+-------------+----------+---------+   +-----------------+-------------+----------+--------+  Right Basilic  Diameter (cm)Depth (cm)Findings  +-----------------+-------------+----------+--------+  Mid upper arm    0.29              +-----------------+-------------+----------+--------+  Dist upper arm    0.28              +-----------------+-------------+----------+--------+  Antecubital fossa  0.30              +-----------------+-------------+----------+--------+  Prox forearm     0.19              +-----------------+-------------+----------+--------+   +-----------------+-------------+----------+--------------+  Left Cephalic  Diameter (cm)Depth (cm)  Findings    +-----------------+-------------+----------+--------------+  Shoulder        0.27                 +-----------------+-------------+----------+--------------+  Prox upper arm    0.22                 +-----------------+-------------+----------+--------------+  Mid upper arm    0.21                 +-----------------+-------------+----------+--------------+  Dist upper arm              not visualized  +-----------------+-------------+----------+--------------+  Antecubital fossa  0.22                 +-----------------+-------------+----------+--------------+  Prox forearm     0.20                 +-----------------+-------------+----------+--------------+  Mid forearm     0.20                 +-----------------+-------------+----------+--------------+  Dist forearm     0.17                 +-----------------+-------------+----------+--------------+   +-----------------+-------------+----------+--------+  Left Basilic   Diameter (cm)Depth (cm)Findings  +-----------------+-------------+----------+--------+  Prox upper arm    0.47              +-----------------+-------------+----------+--------+  Mid upper arm    0.20              +-----------------+-------------+----------+--------+  Dist upper arm    0.21              +-----------------+-------------+----------+--------+  Antecubital fossa 0.14              +-----------------+-------------+----------+--------+   ARTERIAL Right: No obstruction visualized in the right upper extremity. High     brachial bifurcation; axilla.  Left: No obstruction visualized in the left upper extremity.  ASSESSMENT and PLAN   CKD 5: I discussed proceeding with fistula creation.  I believe his best vein is his right brachiocephalic vein.  I discussed the risk of not  maturity as well as the need for future interventions as well as steal syndrome..  I would like to get this done as soon.  I also discussed with the patient that he needs to get evaluated as possible.  Patient also has a lesion that he needs to be evaluated for transplant  Leia Alf, MD, FACS [facility] [phone] Pager [phone]

## 2020-01-31 NOTE — H&P (View-Only) (Signed)
[facility] [place]  Patient name: Isaiah Ramos MRN: [id number] DOB: June 25, 1976 Sex: male   REQUESTING PROVIDER:    Dr. Theador Hawthorne   REASON FOR CONSULT:    Dialysis access  HISTORY OF PRESENT ILLNESS:   Isaiah Ramos is a 44 y.o. male, who is referred for dialysis access.  The patient's renal failure secondary to hypertension.  He is CKD 5.  He is right-handed.  His most recent creatinine was 6.0.  He also suffers from anemia related to renal insufficiency as well as secondary hyperparathyroidism.  PAST MEDICAL HISTORY    Past Medical History:  Diagnosis Date  . Cardiomegaly   . Dyspnea on exertion   . Fracture, foot   . Hyperlipidemia   . Hypertension   . Hypertension   . Kidney disease    Due to HTN  . Plantar fasciitis      FAMILY HISTORY   Family History  Problem Relation Age of Onset  . Hypertension Father   . Diabetes Father   . Hypertension Mother   . Sarcoidosis Sister     SOCIAL HISTORY:   Social History   Socioeconomic History  . Marital status: Married    Spouse name: Not on file  . Number of children: Not on file  . Years of education: Not on file  . Highest education level: Not on file  Occupational History  . Occupation: Merchandiser, retail: UNEMPLOYED  Tobacco Use  . Smoking status: Current Every Day Smoker    Packs/day: 0.50    Types: Cigarettes  . Smokeless tobacco: Never Used  Substance and Sexual Activity  . Alcohol use: Yes    Comment: rare  . Drug use: Yes    Types: Marijuana  . Sexual activity: Not on file  Other Topics Concern  . Not on file  Social History Narrative   Domestic partner   Social Determinants of Health   Financial Resource Strain:   . Difficulty of Paying Living Expenses: Not on file  Food Insecurity:   . Worried About Charity fundraiser in the Last Year: Not on file  . Ran Out of Food in the Last Year: Not on file  Transportation Needs:     . Lack of Transportation (Medical): Not on file  . Lack of Transportation (Non-Medical): Not on file  Physical Activity:   . Days of Exercise per Week: Not on file  . Minutes of Exercise per Session: Not on file  Stress:   . Feeling of Stress : Not on file  Social Connections:   . Frequency of Communication with Friends and Family: Not on file  . Frequency of Social Gatherings with Friends and Family: Not on file  . Attends Religious Services: Not on file  . Active Member of Clubs or Organizations: Not on file  . Attends Archivist Meetings: Not on file  . Marital Status: Not on file  Intimate Partner Violence:   . Fear of Current or Ex-Partner: Not on file  . Emotionally Abused: Not on file  . Physically Abused: Not on file  . Sexually Abused: Not on file    ALLERGIES:    No Known Allergies  CURRENT MEDICATIONS:    Current Outpatient Medications  Medication Sig Dispense Refill  . allopurinol (ZYLOPRIM) 100 MG tablet Take 100 mg by mouth daily.    . bisoprolol (ZEBETA) 5 MG tablet Take by mouth.    . calcitRIOL (ROCALTROL) 0.5 MCG capsule  Take 0.5 mcg by mouth daily.    . calcium acetate (PHOSLO) 667 MG capsule Take 667 mg by mouth 3 (three) times daily with meals.    . calcium-vitamin D (OSCAL WITH D) 500-200 MG-UNIT tablet Take 1 tablet by mouth.    . Cholecalciferol (VITAMIN D3) 125 MCG (5000 UT) CAPS Take 2,000 Units by mouth daily.    . cloNIDine (CATAPRES) 0.2 MG tablet Take 0.2 mg by mouth 2 (two) times daily.    . furosemide (LASIX) 20 MG tablet Take 20 mg by mouth.    . hydrALAZINE (APRESOLINE) 50 MG tablet Take 50 mg by mouth 3 (three) times daily.    Marland Kitchen MITIGARE 0.6 MG CAPS Take 1 capsule by mouth 3 (three) times daily.    Marland Kitchen NIFEdipine (ADALAT CC) 90 MG 24 hr tablet Take 90 mg by mouth daily.    . predniSONE (DELTASONE) 20 MG tablet Take 40 mg by mouth daily.    . sodium bicarbonate 650 MG tablet Take 650 mg by mouth 3 (three) times daily.    .  bisoprolol-hydrochlorothiazide (ZIAC) 10-6.25 MG tablet Take 1 tablet by mouth daily.    Marland Kitchen omeprazole (PRILOSEC) 20 MG capsule Take 20 mg by mouth daily.     No current facility-administered medications for this visit.    REVIEW OF SYSTEMS:   [X]  denotes positive finding, [ ]  denotes negative finding Cardiac  Comments:  Chest pain or chest pressure: x   Shortness of breath upon exertion: x   Short of breath when lying flat:    Irregular heart rhythm: x       Vascular    Pain in calf, thigh, or hip brought on by ambulation:    Pain in feet at night that wakes you up from your sleep:     Blood clot in your veins:    Leg swelling:  x       Pulmonary    Oxygen at home:    Productive cough:     Wheezing:         Neurologic    Sudden weakness in arms or legs:  x   Sudden numbness in arms or legs:     Sudden onset of difficulty speaking or slurred speech:    Temporary loss of vision in one eye:     Problems with dizziness:         Gastrointestinal    Blood in stool:      Vomited blood:         Genitourinary    Burning when urinating:     Blood in urine:        Psychiatric    Major depression:         Hematologic    Bleeding problems:    Problems with blood clotting too easily:        Skin    Rashes or ulcers:        Constitutional    Fever or chills:     PHYSICAL EXAM:   Vitals:   01/31/20 1129  BP: 126/73  Pulse: 78  Resp: 20  Temp: 98.3 F (36.8 C)  SpO2: 100%  Weight: 202 lb (91.6 kg)  Height: 6\' 5"  (1.956 m)    GENERAL: The patient is a well-nourished male, in no acute distress. The vital signs are documented above. CARDIAC: There is a regular rate and rhythm.  VASCULAR: Palpable radial pulses PULMONARY: Nonlabored respirations MUSCULOSKELETAL: There are no major deformities or cyanosis. NEUROLOGIC: No  focal weakness or paresthesias are detected. SKIN: There are no ulcers or rashes noted. PSYCHIATRIC: The patient has a normal  affect.  STUDIES:   I have reviewed the following:  +-----------------+-------------+----------+---------+  Right Cephalic  Diameter (cm)Depth (cm)Findings   +-----------------+-------------+----------+---------+  Shoulder       0.36               +-----------------+-------------+----------+---------+  Prox upper arm    0.28               +-----------------+-------------+----------+---------+  Mid upper arm    0.33               +-----------------+-------------+----------+---------+  Dist upper arm    0.25        branching  +-----------------+-------------+----------+---------+  Antecubital fossa  0.31               +-----------------+-------------+----------+---------+  Prox forearm     0.29               +-----------------+-------------+----------+---------+  Mid forearm     0.23               +-----------------+-------------+----------+---------+  Dist forearm     0.24               +-----------------+-------------+----------+---------+   +-----------------+-------------+----------+--------+  Right Basilic  Diameter (cm)Depth (cm)Findings  +-----------------+-------------+----------+--------+  Mid upper arm    0.29              +-----------------+-------------+----------+--------+  Dist upper arm    0.28              +-----------------+-------------+----------+--------+  Antecubital fossa  0.30              +-----------------+-------------+----------+--------+  Prox forearm     0.19              +-----------------+-------------+----------+--------+   +-----------------+-------------+----------+--------------+  Left Cephalic  Diameter (cm)Depth (cm)  Findings    +-----------------+-------------+----------+--------------+   Shoulder       0.27                 +-----------------+-------------+----------+--------------+  Prox upper arm    0.22                 +-----------------+-------------+----------+--------------+  Mid upper arm    0.21                 +-----------------+-------------+----------+--------------+  Dist upper arm              not visualized  +-----------------+-------------+----------+--------------+  Antecubital fossa  0.22                 +-----------------+-------------+----------+--------------+  Prox forearm     0.20                 +-----------------+-------------+----------+--------------+  Mid forearm     0.20                 +-----------------+-------------+----------+--------------+  Dist forearm     0.17                 +-----------------+-------------+----------+--------------+   +-----------------+-------------+----------+--------+  Left Basilic   Diameter (cm)Depth (cm)Findings  +-----------------+-------------+----------+--------+  Prox upper arm    0.47              +-----------------+-------------+----------+--------+  Mid upper arm    0.20              +-----------------+-------------+----------+--------+  Dist upper arm    0.21              +-----------------+-------------+----------+--------+  Antecubital fossa 0.14              +-----------------+-------------+----------+--------+   ARTERIAL Right: No obstruction visualized in the right upper extremity. High     brachial bifurcation; axilla.  Left: No obstruction visualized in the left upper extremity.  ASSESSMENT and PLAN   CKD 5: I discussed proceeding with fistula creation.  I believe his best vein is his right brachiocephalic vein.  I discussed the  risk of not maturity as well as the need for future interventions as well as steal syndrome..  I would like to get this done as soon.  I also discussed with the patient that he needs to get evaluated as possible.  Patient also has a lesion that he needs to be evaluated for transplant  Leia Alf, MD, FACS [facility] [phone] Pager [phone]

## 2020-02-07 ENCOUNTER — Encounter (HOSPITAL_COMMUNITY): Payer: Self-pay | Admitting: Surgery

## 2020-02-07 ENCOUNTER — Other Ambulatory Visit (HOSPITAL_COMMUNITY)
Admission: RE | Admit: 2020-02-07 | Discharge: 2020-02-07 | Disposition: A | Discharge status: Home / Self Care | Payer: 59 | Source: Ambulatory Visit | Attending: Surgery | Admitting: Surgery

## 2020-02-07 ENCOUNTER — Other Ambulatory Visit: Payer: Self-pay

## 2020-02-07 DIAGNOSIS — Z01812 Encounter for preprocedural laboratory examination: Secondary | ICD-10-CM | POA: Diagnosis present

## 2020-02-07 DIAGNOSIS — Z20822 Contact with and (suspected) exposure to covid-19: Secondary | ICD-10-CM | POA: Diagnosis not present

## 2020-02-07 LAB — SARS CORONAVIRUS 2 (TAT 6-24 HRS): SARS Coronavirus 2: NEGATIVE

## 2020-02-07 NOTE — Progress Notes (Signed)
Spoke with pt for pre-op call. Pt denies cardiac history. Pt is treated for HTN. Pt states he is not diabetic.  Covid test done today. Pt states he's been in quarantine since the test was done and  understands that the quarantine continues until he comes to the hospital on Wednesday.  Pt will bring the name and number of the person day of surgery that will be driving him home and staying with him the 24 hours after surgery.

## 2020-02-08 NOTE — Anesthesia Preprocedure Evaluation (Addendum)
Anesthesia Evaluation  Patient identified by MRN, date of birth, ID band Patient awake    Reviewed: Allergy & Precautions  Airway Mallampati: II  TM Distance: >3 FB     Dental   Pulmonary pneumonia, Current Smoker and Patient abstained from smoking.,    breath sounds clear to auscultation       Cardiovascular hypertension,  Rhythm:Regular Rate:Normal     Neuro/Psych    GI/Hepatic negative GI ROS, Neg liver ROS,   Endo/Other    Renal/GU Renal disease     Musculoskeletal   Abdominal   Peds  Hematology   Anesthesia Other Findings   Reproductive/Obstetrics                            Anesthesia Physical Anesthesia Plan  ASA: III  Anesthesia Plan: General   Post-op Pain Management:    Induction: Intravenous  PONV Risk Score and Plan: 2 and Ondansetron, Dexamethasone and Midazolam  Airway Management Planned: Nasal Cannula and Simple Face Mask  Additional Equipment:   Intra-op Plan:   Post-operative Plan:   Informed Consent: I have reviewed the patients History and Physical, chart, labs and discussed the procedure including the risks, benefits and alternatives for the proposed anesthesia with the patient or authorized representative who has indicated his/her understanding and acceptance.     Dental advisory given  Plan Discussed with: CRNA and Anesthesiologist  Anesthesia Plan Comments:        Anesthesia Quick Evaluation

## 2020-02-09 ENCOUNTER — Encounter (HOSPITAL_COMMUNITY)
Admission: RE | Disposition: A | Discharge status: Home / Self Care | Payer: Self-pay | Source: Ambulatory Visit | Attending: Surgery

## 2020-02-09 ENCOUNTER — Ambulatory Visit (HOSPITAL_COMMUNITY): Payer: 59 | Admitting: Certified Registered Nurse Anesthetist

## 2020-02-09 ENCOUNTER — Ambulatory Visit (HOSPITAL_COMMUNITY)
Admission: RE | Admit: 2020-02-09 | Discharge: 2020-02-09 | Disposition: A | Discharge status: Home / Self Care | Payer: 59 | Source: Ambulatory Visit | Attending: Surgery | Admitting: Surgery

## 2020-02-09 ENCOUNTER — Encounter (HOSPITAL_COMMUNITY): Payer: Self-pay | Admitting: Surgery

## 2020-02-09 ENCOUNTER — Other Ambulatory Visit: Payer: Self-pay

## 2020-02-09 DIAGNOSIS — N185 Chronic kidney disease, stage 5: Secondary | ICD-10-CM | POA: Diagnosis not present

## 2020-02-09 DIAGNOSIS — E785 Hyperlipidemia, unspecified: Secondary | ICD-10-CM | POA: Diagnosis not present

## 2020-02-09 DIAGNOSIS — Z8249 Family history of ischemic heart disease and other diseases of the circulatory system: Secondary | ICD-10-CM | POA: Insufficient documentation

## 2020-02-09 DIAGNOSIS — I129 Hypertensive chronic kidney disease with stage 1 through stage 4 chronic kidney disease, or unspecified chronic kidney disease: Secondary | ICD-10-CM | POA: Insufficient documentation

## 2020-02-09 DIAGNOSIS — F17210 Nicotine dependence, cigarettes, uncomplicated: Secondary | ICD-10-CM | POA: Insufficient documentation

## 2020-02-09 DIAGNOSIS — N2581 Secondary hyperparathyroidism of renal origin: Secondary | ICD-10-CM | POA: Diagnosis not present

## 2020-02-09 DIAGNOSIS — Z833 Family history of diabetes mellitus: Secondary | ICD-10-CM | POA: Insufficient documentation

## 2020-02-09 DIAGNOSIS — Z79899 Other long term (current) drug therapy: Secondary | ICD-10-CM | POA: Diagnosis not present

## 2020-02-09 HISTORY — DX: Pneumonia, unspecified organism: J18.9

## 2020-02-09 HISTORY — DX: Unspecified osteoarthritis, unspecified site: M19.90

## 2020-02-09 HISTORY — DX: Depression, unspecified: F32.A

## 2020-02-09 HISTORY — DX: Anxiety disorder, unspecified: F41.9

## 2020-02-09 HISTORY — PX: AV FISTULA PLACEMENT: SHX1204

## 2020-02-09 LAB — POCT I-STAT, CHEM 8
BUN: 48 mg/dL — ABNORMAL HIGH (ref 6–20)
Calcium, Ion: 1.3 mmol/L (ref 1.15–1.40)
Chloride: 110 mmol/L (ref 98–111)
Creatinine, Ser: 6.5 mg/dL — ABNORMAL HIGH (ref 0.61–1.24)
Glucose, Bld: 98 mg/dL (ref 70–99)
HCT: 36 % — ABNORMAL LOW (ref 39.0–52.0)
Hemoglobin: 12.2 g/dL — ABNORMAL LOW (ref 13.0–17.0)
Potassium: 4.8 mmol/L (ref 3.5–5.1)
Sodium: 142 mmol/L (ref 135–145)
TCO2: 22 mmol/L (ref 22–32)

## 2020-02-09 SURGERY — ARTERIOVENOUS (AV) FISTULA CREATION
Anesthesia: General | Site: Arm Upper | Laterality: Right

## 2020-02-09 MED ORDER — 0.9 % SODIUM CHLORIDE (POUR BTL) OPTIME
TOPICAL | Status: DC | PRN
  Administered 2020-02-09: 1000 mL

## 2020-02-09 MED ORDER — HYDROCODONE-ACETAMINOPHEN 5-325 MG PO TABS
ORAL_TABLET | ORAL | Status: AC
  Filled 2020-02-09: qty 1

## 2020-02-09 MED ORDER — FENTANYL CITRATE (PF) 100 MCG/2ML IJ SOLN
INTRAMUSCULAR | Status: DC | PRN
  Administered 2020-02-09: 50 ug via INTRAVENOUS

## 2020-02-09 MED ORDER — SODIUM CHLORIDE 0.9 % IV SOLN
INTRAVENOUS | Status: DC | PRN
  Administered 2020-02-09: 500 mL

## 2020-02-09 MED ORDER — FENTANYL CITRATE (PF) 250 MCG/5ML IJ SOLN
INTRAMUSCULAR | Status: AC
  Filled 2020-02-09: qty 5

## 2020-02-09 MED ORDER — HEMOSTATIC AGENTS (NO CHARGE) OPTIME
TOPICAL | Status: DC | PRN
  Administered 2020-02-09: 1 via TOPICAL

## 2020-02-09 MED ORDER — CHLORHEXIDINE GLUCONATE 4 % EX LIQD: 60.0000 mL | Freq: Once | CUTANEOUS | Status: DC

## 2020-02-09 MED ORDER — PROPOFOL 500 MG/50ML IV EMUL
INTRAVENOUS | Status: DC | PRN
  Administered 2020-02-09: 100 ug/kg/min via INTRAVENOUS

## 2020-02-09 MED ORDER — HYDROCODONE-ACETAMINOPHEN 5-325 MG PO TABS: 1.0000 | ORAL_TABLET | Freq: Four times a day (QID) | ORAL | 0 refills | Status: DC | PRN

## 2020-02-09 MED ORDER — LIDOCAINE-EPINEPHRINE 1 %-1:100000 IJ SOLN
INTRAMUSCULAR | Status: DC | PRN
  Administered 2020-02-09: 20 mL

## 2020-02-09 MED ORDER — LIDOCAINE-EPINEPHRINE 1 %-1:100000 IJ SOLN
INTRAMUSCULAR | Status: AC
  Filled 2020-02-09: qty 1

## 2020-02-09 MED ORDER — LIDOCAINE-EPINEPHRINE (PF) 1 %-1:200000 IJ SOLN: INTRAMUSCULAR | Status: DC | PRN

## 2020-02-09 MED ORDER — MIDAZOLAM HCL 2 MG/2ML IJ SOLN
INTRAMUSCULAR | Status: AC
  Filled 2020-02-09: qty 2

## 2020-02-09 MED ORDER — SODIUM CHLORIDE 0.9 % IV SOLN
INTRAVENOUS | Status: AC
  Filled 2020-02-09: qty 1.2

## 2020-02-09 MED ORDER — SODIUM CHLORIDE 0.9 % IV SOLN: INTRAVENOUS | Status: DC | PRN

## 2020-02-09 MED ORDER — HYDROCODONE-ACETAMINOPHEN 5-325 MG PO TABS: 1.0000 | ORAL_TABLET | Freq: Once | ORAL | Status: DC

## 2020-02-09 MED ORDER — LIDOCAINE HCL (PF) 1 % IJ SOLN
INTRAMUSCULAR | Status: AC
  Filled 2020-02-09: qty 30

## 2020-02-09 MED ORDER — SODIUM CHLORIDE 0.9 % IV SOLN: INTRAVENOUS | Status: DC

## 2020-02-09 MED ORDER — CEFAZOLIN SODIUM-DEXTROSE 2-4 GM/100ML-% IV SOLN
2.0000 g | INTRAVENOUS | Status: AC
  Administered 2020-02-09: 2 g via INTRAVENOUS
  Filled 2020-02-09: qty 100

## 2020-02-09 MED ORDER — ONDANSETRON HCL 4 MG/2ML IJ SOLN
INTRAMUSCULAR | Status: DC | PRN
  Administered 2020-02-09: 4 mg via INTRAVENOUS

## 2020-02-09 MED ORDER — MIDAZOLAM HCL 5 MG/5ML IJ SOLN
INTRAMUSCULAR | Status: DC | PRN
  Administered 2020-02-09: 2 mg via INTRAVENOUS

## 2020-02-09 MED ORDER — PROPOFOL 10 MG/ML IV BOLUS
INTRAVENOUS | Status: AC
  Filled 2020-02-09: qty 20

## 2020-02-09 SURGICAL SUPPLY — 32 items
ARMBAND PINK RESTRICT EXTREMIT (MISCELLANEOUS) ×4 IMPLANT
CANISTER SUCT 3000ML PPV (MISCELLANEOUS) ×2 IMPLANT
CLIP VESOCCLUDE MED 6/CT (CLIP) ×2 IMPLANT
CLIP VESOCCLUDE SM WIDE 6/CT (CLIP) ×2 IMPLANT
COVER PROBE W GEL 5X96 (DRAPES) ×2 IMPLANT
COVER WAND RF STERILE (DRAPES) ×2 IMPLANT
DERMABOND ADVANCED (GAUZE/BANDAGES/DRESSINGS) ×1
DERMABOND ADVANCED .7 DNX12 (GAUZE/BANDAGES/DRESSINGS) ×1 IMPLANT
ELECT REM PT RETURN 9FT ADLT (ELECTROSURGICAL) ×2
ELECTRODE REM PT RTRN 9FT ADLT (ELECTROSURGICAL) ×1 IMPLANT
GLOVE BIOGEL PI IND STRL 7.5 (GLOVE) ×1 IMPLANT
GLOVE BIOGEL PI INDICATOR 7.5 (GLOVE) ×1
GLOVE SURG SS PI 7.5 STRL IVOR (GLOVE) ×2 IMPLANT
GOWN STRL REUS W/ TWL LRG LVL3 (GOWN DISPOSABLE) ×2 IMPLANT
GOWN STRL REUS W/ TWL XL LVL3 (GOWN DISPOSABLE) ×1 IMPLANT
GOWN STRL REUS W/TWL LRG LVL3 (GOWN DISPOSABLE) ×2
GOWN STRL REUS W/TWL XL LVL3 (GOWN DISPOSABLE) ×1
HEMOSTAT SNOW SURGICEL 2X4 (HEMOSTASIS) ×2 IMPLANT
KIT BASIN OR (CUSTOM PROCEDURE TRAY) ×2 IMPLANT
KIT TURNOVER KIT B (KITS) ×2 IMPLANT
NS IRRIG 1000ML POUR BTL (IV SOLUTION) ×2 IMPLANT
PACK CV ACCESS (CUSTOM PROCEDURE TRAY) ×2 IMPLANT
PAD ARMBOARD 7.5X6 YLW CONV (MISCELLANEOUS) ×4 IMPLANT
SUT PROLENE 6 0 BV (SUTURE) ×4 IMPLANT
SUT PROLENE 6 0 CC (SUTURE) IMPLANT
SUT VIC AB 3-0 SH 27 (SUTURE) ×1
SUT VIC AB 3-0 SH 27X BRD (SUTURE) ×1 IMPLANT
SUT VIC AB 3-0 X1 27 (SUTURE) ×2 IMPLANT
SUT VICRYL 4-0 PS2 18IN ABS (SUTURE) IMPLANT
TOWEL GREEN STERILE (TOWEL DISPOSABLE) ×2 IMPLANT
UNDERPAD 30X30 (UNDERPADS AND DIAPERS) ×2 IMPLANT
WATER STERILE IRR 1000ML POUR (IV SOLUTION) ×2 IMPLANT

## 2020-02-09 NOTE — Transfer of Care (Signed)
Immediate Anesthesia Transfer of Care Note  Patient: Isaiah Ramos  Procedure(s) Performed: RIGHT ARTERIOVENOUS FISTULA CREATION (Right Arm Upper)  Patient Location: PACU  Anesthesia Type:MAC  Level of Consciousness: awake, alert  and oriented  Airway & Oxygen Therapy: Patient Spontanous Breathing and Patient connected to face mask oxygen  Post-op Assessment: Report given to RN and Post -op Vital signs reviewed and stable  Post vital signs: Reviewed and stable  Last Vitals:  Vitals Value Taken Time  BP 109/65 02/09/20 0939  Temp    Pulse 74 02/09/20 0941  Resp 25 02/09/20 0941  SpO2 99 % 02/09/20 0941  Vitals shown include unvalidated device data.  Last Pain:  Vitals:   02/09/20 0940  TempSrc:   PainSc: (P) 0-No pain      Patients Stated Pain Goal: 3 (84/16/60 6301)  Complications: No apparent anesthesia complications

## 2020-02-09 NOTE — Anesthesia Procedure Notes (Signed)
Procedure Name: MAC Date/Time: 02/09/2020 8:35 AM Performed by: Candis Shine, CRNA Pre-anesthesia Checklist: Patient identified, Emergency Drugs available, Suction available, Patient being monitored and Timeout performed Patient Re-evaluated:Patient Re-evaluated prior to induction Oxygen Delivery Method: Simple face mask Dental Injury: Teeth and Oropharynx as per pre-operative assessment

## 2020-02-09 NOTE — Anesthesia Postprocedure Evaluation (Signed)
Anesthesia Post Note  Patient: Isaiah Ramos  Procedure(s) Performed: RIGHT ARTERIOVENOUS FISTULA CREATION (Right Arm Upper)     Patient location during evaluation: PACU Anesthesia Type: General Level of consciousness: awake Pain management: pain level controlled Vital Signs Assessment: post-procedure vital signs reviewed and stable Respiratory status: spontaneous breathing Cardiovascular status: stable Postop Assessment: no apparent nausea or vomiting Anesthetic complications: no    Last Vitals:  Vitals:   02/09/20 1010 02/09/20 1011  BP: 107/66 (!) 142/94  Pulse: 67 63  Resp: 18   Temp: 36.9 C   SpO2: 100% 100%    Last Pain:  Vitals:   02/09/20 1010  TempSrc:   PainSc: 0-No pain                 Dencil Cayson

## 2020-02-09 NOTE — Discharge Instructions (Signed)
   [facility]  Discharge Instructions  AV Fistula or Graft Surgery for Dialysis Access  Please refer to the following instructions for your post-procedure care. Your surgeon or physician assistant will discuss any changes with you.  Activity  You may drive the day following your surgery, if you are comfortable and no longer taking prescription pain medication. Resume full activity as the soreness in your incision resolves.  Bathing/Showering  You may shower after you go home. Keep your incision dry for 48 hours. Do not soak in a bathtub, hot tub, or swim until the incision heals completely. You may not shower if you have a hemodialysis catheter.  Incision Care  Clean your incision with mild soap and water after 48 hours. Pat the area dry with a clean towel. You do not need a bandage unless otherwise instructed. Do not apply any ointments or creams to your incision. You may have skin glue on your incision. Do not peel it off. It will come off on its own in about one week. Your arm may swell a bit after surgery. To reduce swelling use pillows to elevate your arm so it is above your heart. Your doctor will tell you if you need to lightly wrap your arm with an ACE bandage.  Diet  Resume your normal diet. There are not special food restrictions following this procedure. In order to heal from your surgery, it is CRITICAL to get adequate nutrition. Your body requires vitamins, minerals, and protein. Vegetables are the best source of vitamins and minerals. Vegetables also provide the perfect balance of protein. Processed food has little nutritional value, so try to avoid this.  Medications  Resume taking all of your medications. If your incision is causing pain, you may take over-the counter pain relievers such as acetaminophen (Tylenol). If you were prescribed a stronger pain medication, please be aware these medications can cause nausea and constipation. Prevent  nausea by taking the medication with a snack or meal. Avoid constipation by drinking plenty of fluids and eating foods with high amount of fiber, such as fruits, vegetables, and grains.  Do not take Tylenol if you are taking prescription pain medications.  Follow up Your surgeon may want to see you in the office following your access surgery. If so, this will be arranged at the time of your surgery.  Please call us immediately for any of the following conditions:  . Increased pain, redness, drainage (pus) from your incision site . Fever of 101 degrees or higher . Severe or worsening pain at your incision site . Hand pain or numbness. .  Reduce your risk of vascular disease:  . Stop smoking. If you would like help, call [facility] at 1-800-QUIT-NOW 670-373-2388) or [place] at (626)525-1732  . Manage your cholesterol . Maintain a desired weight . Control your diabetes . Keep your blood pressure down  Dialysis  It will take several weeks to several months for your new dialysis access to be ready for use. Your surgeon will determine when it is okay to use it. Your nephrologist will continue to direct your dialysis. You can continue to use your Permcath until your new access is ready for use.   02/09/2020 Isaiah DRUMWRIGHT [id number] 1976/04/28  Surgeon(s): Serafina Mitchell, MD  Procedure(s): Creation right brachiocephalic AV fistula  x Do not stick fistula for 12 weeks    If you have any questions, please call the office at [phone].

## 2020-02-09 NOTE — Op Note (Signed)
    Patient name: Isaiah Ramos MRN: [id number] DOB: 1976-08-30 Sex: male  02/09/2020 Pre-operative Diagnosis: CKD Post-operative diagnosis:  Same Surgeon:  [name] Assistants: Aldona Bar Ryne Procedure:   Right brachiocephalic fistula Anesthesia: MAC Blood Loss: None Specimens: None  Findings: Healthy appearing 3.5 mm artery.  Healthy 3.5 mm cephalic vein  Indications: Patient comes in today for dialysis access.  Procedure:  The patient was identified in the holding area and taken to [place] [facility]  The patient was then placed supine on the table. MAC anesthesia was administered.  The patient was prepped and draped in the usual sterile fashion.  A time out was called and antibiotics were administered.  1% lidocaine was used for local anesthesia.  Ultrasound was used to the outside vein in the upper arm which appeared to be adequate for fistula creation measuring about 3 mm throughout.  The transverse incision was made just proximal to the antecubital crease.  First dissected out the brachial artery which was a disease free 3 mm artery.  I then mobilized the cephalic vein throughout the width of the incision.  It was marked for orientation and then ligated distally.  It distended nicely with heparin saline.  The brachial artery was then occluded.  The vein was cut appropriately.  A #11 blade was used to make an arteriotomy which was extended longitudinally with Potts scissors.  A end-to-side anastomosis was performed with 6-0 Prolene.  Prior to completion the appropriate flushing maneuvers were performed and the anastomosis was completed.  The clamps were released.  There is a good thrill in the fistula.  Patient had a brisk radial artery Doppler signal.  Hemostasis was achieved and incision was closed with 2 layers of 3-0 Vicryl followed by Dermabond.  There are no immediate complications.   Disposition: To PACU stable.   Theotis Burrow, M.D., [facility]  [place] Office: [phone] Pager:  [phone]

## 2020-02-09 NOTE — Interval H&P Note (Signed)
History and Physical Interval Note:  02/09/2020 7:50 AM  Isaiah Ramos  has presented today for surgery, with the diagnosis of CHRONIC KIDNEY DISEASE STAGE 5.  The various methods of treatment have been discussed with the patient and family. After consideration of risks, benefits and other options for treatment, the patient has consented to  Procedure(s): ARTERIOVENOUS (AV) FISTULA CREATION (Right) as a surgical intervention.  The patient's history has been reviewed, patient examined, no change in status, stable for surgery.  I have reviewed the patient's chart and labs.  Questions were answered to the patient's satisfaction.     [name]

## 2020-03-09 ENCOUNTER — Telehealth (HOSPITAL_COMMUNITY): Payer: Self-pay

## 2020-03-09 ENCOUNTER — Other Ambulatory Visit: Payer: Self-pay | Admitting: *Deleted

## 2020-03-09 DIAGNOSIS — N185 Chronic kidney disease, stage 5: Secondary | ICD-10-CM

## 2020-03-09 NOTE — Telephone Encounter (Signed)

## 2020-03-13 ENCOUNTER — Other Ambulatory Visit: Payer: Self-pay

## 2020-03-13 ENCOUNTER — Ambulatory Visit (INDEPENDENT_AMBULATORY_CARE_PROVIDER_SITE_OTHER): Payer: Self-pay | Admitting: Physician Assistant

## 2020-03-13 ENCOUNTER — Ambulatory Visit (HOSPITAL_COMMUNITY)
Admission: RE | Admit: 2020-03-13 | Discharge: 2020-03-13 | Disposition: A | Discharge status: Home / Self Care | Payer: 59 | Source: Ambulatory Visit | Attending: Surgery | Admitting: Surgery

## 2020-03-13 VITALS — BP 117/62 | HR 64 | Temp 98.3°F | Resp 20 | Ht 77.0 in | Wt 202.0 lb

## 2020-03-13 DIAGNOSIS — N185 Chronic kidney disease, stage 5: Secondary | ICD-10-CM | POA: Diagnosis present

## 2020-03-13 NOTE — Progress Notes (Signed)
  POST OPERATIVE OFFICE NOTE    CC:  F/u for surgery  HPI:  This is a 44 y.o. male who is s/p right BC AVF on 02/09/2020 by Dr. Trula Slade for CKD 5.  He presents today for follow up.  He is doing well and denies any evidence of steal sx.    The pt is not on dialysis.  No Known Allergies  Current Outpatient Medications  Medication Sig Dispense Refill  . allopurinol (ZYLOPRIM) 100 MG tablet Take 100 mg by mouth daily.    . bisoprolol-hydrochlorothiazide (ZIAC) 10-6.25 MG tablet Take 1 tablet by mouth daily.    . calcitRIOL (ROCALTROL) 0.25 MCG capsule Take 0.25 mcg by mouth daily.     . Cholecalciferol (VITAMIN D3) 50 MCG (2000 UT) TABS Take 2,000 Units by mouth daily.     . cloNIDine (CATAPRES) 0.2 MG tablet Take 0.2 mg by mouth 2 (two) times daily.    . furosemide (LASIX) 20 MG tablet Take 20 mg by mouth daily.     . hydrALAZINE (APRESOLINE) 50 MG tablet Take 50 mg by mouth 3 (three) times daily.    Marland Kitchen HYDROcodone-acetaminophen (NORCO/VICODIN) 5-325 MG tablet Take 1 tablet by mouth every 6 (six) hours as needed for moderate pain. 8 tablet 0  . NIFEdipine (ADALAT CC) 90 MG 24 hr tablet Take 90 mg by mouth daily.    Marland Kitchen omeprazole (PRILOSEC) 20 MG capsule Take 20 mg by mouth daily.    . sodium bicarbonate 650 MG tablet Take 650 mg by mouth 3 (three) times daily.     No current facility-administered medications for this visit.     ROS:  See HPI  Physical Exam:  Today's Vitals   03/13/20 1442  BP: 117/62  Pulse: 64  Resp: 20  Temp: 98.3 F (36.8 C)  SpO2: 100%  Weight: 202 lb (91.6 kg)  Height: 6\' 5"  (1.956 m)   Body mass index is 23.95 kg/m.   Incision:  Healed nicely Extremities:  There is a palpable right radial and ulnar pulse.  Motor and sensory are in tact.  There is a thrill/bruit present.   Dialysis Duplex on 03/13/2020: Diameter:  0.57cm-0.70cm Depth:  0.27cm-0.52cm (competing branches at distal ua and proximal ua).   Assessment/Plan:  This is a 44 y.o. male who is  s/p:  right BC AVF on 02/09/2020 by Dr. Trula Slade.  -the fistula is maturing nicely and should be ready to use in 2 months.  -the pt does not have evidence of steal.  I did discuss sx of steal with pt and he knows to call as soon as possible should he develop these.  -the fistula/graft can be used 05/11/2020.   -the pt will follow up as needed.    Leontine Locket, PA-C [facility] [phone]  Clinic MD:  Trula Slade

## 2021-03-17 ENCOUNTER — Other Ambulatory Visit: Payer: Self-pay

## 2021-03-17 DIAGNOSIS — N185 Chronic kidney disease, stage 5: Secondary | ICD-10-CM

## 2021-03-28 ENCOUNTER — Ambulatory Visit (INDEPENDENT_AMBULATORY_CARE_PROVIDER_SITE_OTHER): Payer: 59 | Admitting: Physician Assistant

## 2021-03-28 ENCOUNTER — Ambulatory Visit (HOSPITAL_COMMUNITY)
Admission: RE | Admit: 2021-03-28 | Discharge: 2021-03-28 | Disposition: A | Discharge status: Home / Self Care | Payer: 59 | Source: Ambulatory Visit | Attending: Vascular Surgery | Admitting: Vascular Surgery

## 2021-03-28 ENCOUNTER — Other Ambulatory Visit: Payer: Self-pay

## 2021-03-28 VITALS — BP 99/60 | HR 65 | Temp 98.5°F | Resp 20 | Ht 77.0 in | Wt 200.6 lb

## 2021-03-28 DIAGNOSIS — N186 End stage renal disease: Secondary | ICD-10-CM | POA: Diagnosis not present

## 2021-03-28 DIAGNOSIS — N185 Chronic kidney disease, stage 5: Secondary | ICD-10-CM | POA: Insufficient documentation

## 2021-03-28 DIAGNOSIS — Z992 Dependence on renal dialysis: Secondary | ICD-10-CM
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
## 2021-04-05 ENCOUNTER — Other Ambulatory Visit: Payer: Self-pay

## 2021-04-09 ENCOUNTER — Other Ambulatory Visit (HOSPITAL_COMMUNITY)
Admission: RE | Admit: 2021-04-09 | Discharge: 2021-04-09 | Disposition: A | Discharge status: Home / Self Care | Payer: 59 | Source: Ambulatory Visit | Attending: Surgery | Admitting: Surgery

## 2021-04-09 DIAGNOSIS — Z20822 Contact with and (suspected) exposure to covid-19: Secondary | ICD-10-CM | POA: Diagnosis not present

## 2021-04-09 DIAGNOSIS — Z01812 Encounter for preprocedural laboratory examination: Secondary | ICD-10-CM | POA: Insufficient documentation

## 2021-04-10 ENCOUNTER — Ambulatory Visit (HOSPITAL_COMMUNITY)
Admission: RE | Admit: 2021-04-10 | Discharge: 2021-04-10 | Disposition: A | Discharge status: Home / Self Care | Payer: 59 | Attending: Surgery | Admitting: Surgery

## 2021-04-10 ENCOUNTER — Other Ambulatory Visit: Payer: Self-pay

## 2021-04-10 ENCOUNTER — Encounter (HOSPITAL_COMMUNITY)
Admission: RE | Disposition: A | Discharge status: Home / Self Care | Payer: Self-pay | Source: Home / Self Care | Attending: Surgery

## 2021-04-10 DIAGNOSIS — I120 Hypertensive chronic kidney disease with stage 5 chronic kidney disease or end stage renal disease: Secondary | ICD-10-CM | POA: Insufficient documentation

## 2021-04-10 DIAGNOSIS — N186 End stage renal disease: Secondary | ICD-10-CM | POA: Insufficient documentation

## 2021-04-10 DIAGNOSIS — Z79899 Other long term (current) drug therapy: Secondary | ICD-10-CM | POA: Diagnosis not present

## 2021-04-10 DIAGNOSIS — F17210 Nicotine dependence, cigarettes, uncomplicated: Secondary | ICD-10-CM | POA: Insufficient documentation

## 2021-04-10 DIAGNOSIS — Y841 Kidney dialysis as the cause of abnormal reaction of the patient, or of later complication, without mention of misadventure at the time of the procedure: Secondary | ICD-10-CM | POA: Insufficient documentation

## 2021-04-10 DIAGNOSIS — N185 Chronic kidney disease, stage 5: Secondary | ICD-10-CM | POA: Diagnosis not present

## 2021-04-10 DIAGNOSIS — Z992 Dependence on renal dialysis: Secondary | ICD-10-CM | POA: Insufficient documentation

## 2021-04-10 DIAGNOSIS — T82858A Stenosis of vascular prosthetic devices, implants and grafts, initial encounter: Secondary | ICD-10-CM | POA: Diagnosis present

## 2021-04-10 HISTORY — PX: PERIPHERAL VASCULAR BALLOON ANGIOPLASTY: CATH118281

## 2021-04-10 LAB — POCT I-STAT, CHEM 8
BUN: 55 mg/dL — ABNORMAL HIGH (ref 6–20)
Calcium, Ion: 1.09 mmol/L — ABNORMAL LOW (ref 1.15–1.40)
Chloride: 99 mmol/L (ref 98–111)
Creatinine, Ser: 14.8 mg/dL — ABNORMAL HIGH (ref 0.61–1.24)
Glucose, Bld: 94 mg/dL (ref 70–99)
HCT: 38 % — ABNORMAL LOW (ref 39.0–52.0)
Hemoglobin: 12.9 g/dL — ABNORMAL LOW (ref 13.0–17.0)
Potassium: 4 mmol/L (ref 3.5–5.1)
Sodium: 140 mmol/L (ref 135–145)
TCO2: 30 mmol/L (ref 22–32)

## 2021-04-10 LAB — SARS CORONAVIRUS 2 (TAT 6-24 HRS): SARS Coronavirus 2: NEGATIVE

## 2021-04-10 SURGERY — PERIPHERAL VASCULAR BALLOON ANGIOPLASTY
Anesthesia: LOCAL | Laterality: Right

## 2021-04-10 MED ORDER — SODIUM CHLORIDE 0.9% FLUSH: 3.0000 mL | Freq: Two times a day (BID) | INTRAVENOUS | Status: DC

## 2021-04-10 MED ORDER — FENTANYL CITRATE (PF) 100 MCG/2ML IJ SOLN
INTRAMUSCULAR | Status: AC
  Filled 2021-04-10: qty 2

## 2021-04-10 MED ORDER — LIDOCAINE HCL (PF) 1 % IJ SOLN
INTRAMUSCULAR | Status: DC | PRN
  Administered 2021-04-10 (×2): 2 mL via INTRADERMAL

## 2021-04-10 MED ORDER — HEPARIN SODIUM (PORCINE) 1000 UNIT/ML IJ SOLN
INTRAMUSCULAR | Status: AC
  Filled 2021-04-10: qty 1

## 2021-04-10 MED ORDER — SODIUM CHLORIDE 0.9 % IV SOLN: 250.0000 mL | INTRAVENOUS | Status: DC | PRN

## 2021-04-10 MED ORDER — HEPARIN (PORCINE) IN NACL 1000-0.9 UT/500ML-% IV SOLN
INTRAVENOUS | Status: DC | PRN
  Administered 2021-04-10: 500 mL

## 2021-04-10 MED ORDER — SODIUM CHLORIDE 0.9% FLUSH: 3.0000 mL | INTRAVENOUS | Status: DC | PRN

## 2021-04-10 MED ORDER — MIDAZOLAM HCL 2 MG/2ML IJ SOLN
INTRAMUSCULAR | Status: DC | PRN
  Administered 2021-04-10: 1 mg via INTRAVENOUS

## 2021-04-10 MED ORDER — IODIXANOL 320 MG/ML IV SOLN
INTRAVENOUS | Status: DC | PRN
  Administered 2021-04-10: 60 mL via INTRAVENOUS

## 2021-04-10 MED ORDER — HEPARIN SODIUM (PORCINE) 1000 UNIT/ML IJ SOLN
INTRAMUSCULAR | Status: DC | PRN
  Administered 2021-04-10: 3000 [IU] via INTRAVENOUS

## 2021-04-10 MED ORDER — MIDAZOLAM HCL 2 MG/2ML IJ SOLN
INTRAMUSCULAR | Status: AC
  Filled 2021-04-10: qty 2

## 2021-04-10 MED ORDER — LIDOCAINE HCL (PF) 1 % IJ SOLN
INTRAMUSCULAR | Status: AC
  Filled 2021-04-10: qty 30

## 2021-04-10 MED ORDER — FENTANYL CITRATE (PF) 100 MCG/2ML IJ SOLN
INTRAMUSCULAR | Status: DC | PRN
  Administered 2021-04-10: 25 ug via INTRAVENOUS

## 2021-04-10 MED ORDER — HEPARIN (PORCINE) IN NACL 1000-0.9 UT/500ML-% IV SOLN
INTRAVENOUS | Status: AC
  Filled 2021-04-10: qty 500

## 2021-04-10 SURGICAL SUPPLY — 17 items
BAG SNAP BAND KOVER 36X36 (MISCELLANEOUS) ×3 IMPLANT
BALLN MUSTANG 7.0X20 75 (BALLOONS) ×6
BALLOON MUSTANG 7.0X20 75 (BALLOONS) ×4 IMPLANT
COVER DOME SNAP 22 D (MISCELLANEOUS) ×3 IMPLANT
DCB RANGER 4.0X40 135 (BALLOONS) ×4 IMPLANT
KIT ENCORE 26 ADVANTAGE (KITS) ×3 IMPLANT
KIT MICROPUNCTURE NIT STIFF (SHEATH) ×3 IMPLANT
PROTECTION STATION PRESSURIZED (MISCELLANEOUS) ×3
RANGER DCB 4.0X40 135 (BALLOONS) ×6
SHEATH PINNACLE R/O II 5F 6CM (SHEATH) ×3 IMPLANT
SHEATH PROBE COVER 6X72 (BAG) ×3 IMPLANT
STATION PROTECTION PRESSURIZED (MISCELLANEOUS) ×2 IMPLANT
STOPCOCK MORSE 400PSI 3WAY (MISCELLANEOUS) ×3 IMPLANT
TRAY PV CATH (CUSTOM PROCEDURE TRAY) ×3 IMPLANT
TUBING CIL FLEX 10 FLL-RA (TUBING) ×3 IMPLANT
WIRE SPARTACORE .014X190CM (WIRE) ×3 IMPLANT
WIRE STARTER BENTSON 035X150 (WIRE) ×3 IMPLANT

## 2021-04-10 NOTE — Op Note (Signed)
    Patient name: Isaiah Ramos MRN: [id number] DOB: [birth year] Sex: male  04/10/2021 Pre-operative Diagnosis: ESRD Post-operative diagnosis:  Same Surgeon:  [name] Procedure Performed:  1.  Ultrasound-guided access, right cephalic vein x2  2.  Fistulogram  3.  Cephalic vein venoplasty (peripheral)  4.  Conscious sedation, 41 minutes     Indications: The patient is having difficulty with his flow rates.  He comes in today for fistulogram  Procedure:  The patient was identified in the holding area and taken to room 8.  The patient was then placed supine on the table and prepped and draped in the usual sterile fashion.  A time out was called.  Conscious sedation was administered with the use of IV fentanyl and Versed under continuous physician and nurse monitoring.  Heart rate, blood pressure, and oxygen saturations were continuously monitored.  Total sedation time was 41 minutes ultrasound was used to evaluate the fistula.  The vein was patent and compressible.  A digital ultrasound image was acquired.  The fistula was then accessed under ultrasound guidance using a micropuncture needle.  An 018 wire was then asvanced without resistance and a micropuncture sheath was placed.  Contrast injections were then performed through the sheath.  Findings: The central venous system is widely patent.  The cephalic vein is patent throughout its course.  Near the antecubital crease, and the arteriovenous anastomosis there is approximately a 50-60% stenosis.   Intervention: After the above images were acquired, I removed the existing access and closed it with a Monocryl suture.  I then obtained a second access point towards the hand under ultrasound guidance with a micropuncture needle.  A 5 French sheath was inserted.  3000 units of heparin were given.  A Sparta core wire was advanced into the brachial artery.  I initially performed drug-coated balloon angioplasty with a 4 x 40 Ranger balloon.  I then  repeated angioplasty of the narrowed area with a 7 x 20 Mustang balloon.  Residual stenosis was less than 15%.  The sheath was removed with a Monocryl suture.  Impression:  #1  Successful venoplasty of a 50 to 123456 proximal cephalic vein fistula stenosis using a 4 mm drug-coated balloon followed by 7 mm balloon with residual stenosis less than 15%  #2  The fistula remains amenable to percutaneous intervention and is ready for use   V. [name], M.D., [facility] [place] Office: [phone] Pager:  [phone]

## 2021-04-10 NOTE — Progress Notes (Signed)
Right upper arm fistula with good bruit and thrill

## 2021-04-10 NOTE — Interval H&P Note (Signed)
History and Physical Interval Note:  04/10/2021 8:05 AM  Isaiah Ramos  has presented today for surgery, with the diagnosis of end stage renal.  The various methods of treatment have been discussed with the patient and family. After consideration of risks, benefits and other options for treatment, the patient has consented to  Procedure(s): A/V FISTULAGRAM (N/A) as a surgical intervention.  The patient's history has been reviewed, patient examined, no change in status, stable for surgery.  I have reviewed the patient's chart and labs.  Questions were answered to the patient's satisfaction.     [name]

## 2021-04-10 NOTE — Discharge Instructions (Signed)
Dialysis Fistulogram, Care After The following information offers guidance on how to care for yourself after your procedure. Your health care provider may also give you more specific instructions. If you have problems or questions, contact your health care provider. What can I expect after the procedure? After the procedure, it is common to have:  A small amount of discomfort in the area where the small tube (catheter) was placed for the procedure.  A small amount of bruising around the fistula.  Sleepiness and tiredness (fatigue). Follow these instructions at home: Puncture site care  Follow instructions from your health care provider about how to take care of the site where catheters were inserted. Make sure you: ? Wash your hands with soap and water for at least 20 seconds before and after you change your bandage (dressing). If soap and water are not available, use hand sanitizer. ? Change your dressing as told by your health care provider. ? Leave stitches (sutures), skin glue, or adhesive strips in place. These skin closures may need to stay in place for 2 weeks or longer. If adhesive strip edges start to loosen and curl up, you may trim the loose edges. Do not remove adhesive strips completely unless your health care provider tells you to do that.  Check your puncture area every day for signs of infection. Check for: ? More redness, swelling, or pain. ? Fluid or blood. ? Warmth. ? Pus or a bad smell.   Activity  Rest as much as you can.  If you were given a sedative during the procedure, it can affect you for several hours. Do not drive or operate machinery until your health care provider says that it is safe.  Do not lift anything that is heavier than 5 lb (2.3 kg), or the limit that you are told, on the day of your procedure.  Do not do anything strenuous with your arm for the rest of the day. Avoid household activities, such as vacuuming.  Return to your normal activities as  told by your health care provider. Ask your health care provider what activities are safe for you. Safety To prevent damage to your graft or fistula:  Do not wear tight-fitting clothing or jewelry on the arm or leg that has your graft or fistula.  Tell all your health care providers that you have a dialysis fistula or graft.  Do not allow blood draws, IVs, or blood pressure readings to be done in the arm that has your fistula or graft.  Do not allow flu shots or vaccinations in the arm with your fistula or graft. General instructions  Take over-the-counter and prescription medicines only as told by your health care provider.  Do not take baths, swim, or use a hot tub until your health care provider approves. Ask your health care provider if you may take showers. You may only be allowed to take sponge baths.  Monitor your dialysis fistula closely. Check to make sure that you can feel a vibration or buzz (a thrill) when you put your fingers over the fistula.  Keep all follow-up visits. This is important. Contact a health care provider if:  You have more redness, swelling, or pain at the site where the catheter was put in.  You have fluid or blood coming from the catheter site.  You have pus or a bad smell coming from the catheter site.  Your catheter site feels warm.  You have a fever or chills. Get help right away if:    You have bleeding from the vascular access site that does not stop.  You feel weak.  You have trouble balancing.  You have trouble moving your arms or legs.  You have problems with your speech or vision.  You can no longer feel a vibration or buzz when you put your fingers over your fistula.  The limb that was used for the procedure swells or becomes painful, cold, blue, or pale white.  You have chest pain or shortness of breath. These symptoms may represent a serious problem that is an emergency. Do not wait to see if the symptoms will go away. Get  medical help right away. Call your local emergency services (911 in the [place] Do not drive yourself to the hospital. Summary  After a dialysis fistulogram, it is common to have a small amount of discomfort or bruising in the area where the small, thin tube (catheter) was placed.  Rest as much as you can after your procedure. Return to your normal activities as told by your health care provider.  Take over-the-counter and prescription medicines only as told by your health care provider.  Follow instructions from your health care provider about how to take care of the site where the catheter was inserted.  Keep all follow-up visits. This is important. This information is not intended to replace advice given to you by your health care provider. Make sure you discuss any questions you have with your health care provider. Document Revised: 07/05/2020 Document Reviewed: 07/05/2020 Elsevier Patient Education  2021 Elsevier Inc.  

## 2021-04-11 ENCOUNTER — Encounter (HOSPITAL_COMMUNITY): Payer: Self-pay | Admitting: Surgery

## 2021-06-04 ENCOUNTER — Other Ambulatory Visit (INDEPENDENT_AMBULATORY_CARE_PROVIDER_SITE_OTHER): Payer: Self-pay

## 2021-06-04 ENCOUNTER — Encounter (INDEPENDENT_AMBULATORY_CARE_PROVIDER_SITE_OTHER): Payer: Self-pay | Admitting: Gastroenterology

## 2021-06-04 ENCOUNTER — Telehealth (INDEPENDENT_AMBULATORY_CARE_PROVIDER_SITE_OTHER): Payer: Self-pay

## 2021-06-04 ENCOUNTER — Ambulatory Visit (INDEPENDENT_AMBULATORY_CARE_PROVIDER_SITE_OTHER): Payer: 59 | Admitting: Gastroenterology

## 2021-06-04 ENCOUNTER — Encounter (INDEPENDENT_AMBULATORY_CARE_PROVIDER_SITE_OTHER): Payer: Self-pay

## 2021-06-04 ENCOUNTER — Other Ambulatory Visit: Payer: Self-pay

## 2021-06-04 DIAGNOSIS — R1013 Epigastric pain: Secondary | ICD-10-CM | POA: Diagnosis not present

## 2021-06-04 DIAGNOSIS — Z01812 Encounter for preprocedural laboratory examination: Secondary | ICD-10-CM

## 2021-06-04 MED ORDER — DICYCLOMINE HCL 10 MG PO CAPS: 10.0000 mg | ORAL_CAPSULE | Freq: Two times a day (BID) | ORAL | 2 refills | Status: AC | PRN

## 2021-06-04 MED ORDER — PEG 3350-KCL-NA BICARB-NACL 420 G PO SOLR
4000.0000 mL | ORAL | 0 refills | Status: DC
Start: 2021-06-04 — End: 2021-06-08

## 2021-06-04 NOTE — H&P (View-Only) (Signed)
Maylon Peppers, M.D. Gastroenterology & Hepatology [facility] [address] [place], [place] [address]  Primary Care Physician: Neale Burly, MD [place] [phone]  I will communicate my assessment and recommendations to the referring MD via EMR.  Problems: Abdominal pain  History of Present Illness: Isaiah Ramos is a 45 y.o. male with past medical history of anxiety, depression, end-stage renal disease on dialysis, hypertension, gout, who presents for evaluation of abdominal pain.  Patient reports that he has presented intermittent episodes of abdominal pain described as tightness that radiates to his RUQ. He has presented these episodes for the last 2 years. He noticed the pain is usually present after  he gets upset or excited. The pain usually lasts for a few minutes.  States he has these episodes 2 times a week on average.  The used to be very infrequent in the past but have become more frequent recently.  He does not take any medication for the pain. The patient denies having any nausea, vomiting, fever, chills, hematochezia, melena, heartburn, hematemesis, abdominal distention,  diarrhea, jaundice, pruritus. States he has been losing a fair amount of weight after starting dialysis - went from 230 to 190 in 4 months. States his appetite is down.  Overall, he feels that for the last month his weight has been somewhat stable as it only fluctuates a few pounds in between dialysis sessions.  No recent abdominal imaging has been performed. Last EGD: never Last Colonoscopy: never  Smokes 1/2 pack a day.  Past Medical History: Past Medical History:  Diagnosis Date   Anxiety    Arthritis    Gout   Cardiomegaly    Depression    Dyspnea on exertion    Fracture, foot    Hyperlipidemia    Hypertension    Hypertension    Kidney disease    Due to HTN, Stage 5   Plantar fasciitis    Pneumonia     Past  Surgical History: Past Surgical History:  Procedure Laterality Date   AV FISTULA PLACEMENT Right 02/09/2020   Procedure: RIGHT ARTERIOVENOUS FISTULA CREATION;  Surgeon: Serafina Mitchell, MD;  Location: [facility];  Service: Vascular;  Laterality: Right;   HERNIA REPAIR     PERIPHERAL VASCULAR BALLOON ANGIOPLASTY Right 04/10/2021   Procedure: PERIPHERAL VASCULAR BALLOON ANGIOPLASTY;  Surgeon: Serafina Mitchell, MD;  Location: [facility];  Service: Cardiovascular;  Laterality: Right;  Arm fistual   PYLOROPLASTY      Family History: Family History  Problem Relation Age of Onset   Hypertension Father    Diabetes Father    Hypertension Mother    Sarcoidosis Sister     Social History: Social History   Tobacco Use  Smoking Status Every Day   Packs/day: 0.50   Pack years: 0.00   Types: Cigarettes  Smokeless Tobacco Never   Social History   Substance and Sexual Activity  Alcohol Use Not Currently   Comment: rare   Social History   Substance and Sexual Activity  Drug Use Yes   Types: Marijuana    Allergies: No Known Allergies  Medications: Current Outpatient Medications  Medication Sig Dispense Refill   allopurinol (ZYLOPRIM) 100 MG tablet Take 100 mg by mouth in the morning.     bisoprolol-hydrochlorothiazide (ZIAC) 10-6.25 MG tablet Take 1 tablet by mouth in the morning.     calcitRIOL (ROCALTROL) 0.25 MCG capsule Take 0.25 mcg by mouth in  the morning.     calcium acetate (PHOSLO) 667 MG capsule Take 667 mg by mouth 2 (two) times daily with a meal.     cloNIDine (CATAPRES) 0.2 MG tablet Take 0.2 mg by mouth 2 (two) times daily.     dicyclomine (BENTYL) 10 MG capsule Take 1 capsule (10 mg total) by mouth every 12 (twelve) hours as needed (abdominal pain). 60 capsule 2   furosemide (LASIX) 20 MG tablet Take 20 mg by mouth in the morning.     hydrALAZINE (APRESOLINE) 50 MG tablet Take 50 mg by mouth 3 (three) times daily.     NIFEdipine (ADALAT CC) 90 MG 24 hr tablet Take  90 mg by mouth in the morning.     omeprazole (PRILOSEC) 20 MG capsule Take 20 mg by mouth in the morning.     sodium bicarbonate 650 MG tablet Take 650 mg by mouth 3 (three) times daily.     No current facility-administered medications for this visit.    Review of Systems: GENERAL: negative for malaise, night sweats HEENT: No changes in hearing or vision, no nose bleeds or other nasal problems. NECK: Negative for lumps, goiter, pain and significant neck swelling RESPIRATORY: Negative for cough, wheezing CARDIOVASCULAR: Negative for chest pain, leg swelling, palpitations, orthopnea GI: SEE HPI MUSCULOSKELETAL: Negative for joint pain or swelling, back pain, and muscle pain. SKIN: Negative for lesions, rash PSYCH: Negative for sleep disturbance, mood disorder and recent psychosocial stressors. HEMATOLOGY Negative for prolonged bleeding, bruising easily, and swollen nodes. ENDOCRINE: Negative for cold or heat intolerance, polyuria, polydipsia and goiter. NEURO: negative for tremor, gait imbalance, syncope and seizures. The remainder of the review of systems is noncontributory.   Physical Exam: BP 130/80 (BP Location: Right Arm, Patient Position: Sitting, Cuff Size: Large)   Pulse 77   Temp 98.8 F (37.1 C) (Oral)   Ht '6\' 5"'$  (1.956 m)   Wt 197 lb 14.4 oz (89.8 kg)   BMI 23.47 kg/m  GENERAL: The patient is AO x3, in no acute distress. HEENT: Head is normocephalic and atraumatic. EOMI are intact. Mouth is well hydrated and without lesions. NECK: Supple. No masses LUNGS: Clear to auscultation. No presence of rhonchi/wheezing/rales. Adequate chest expansion HEART: RRR, normal s1 and s2. ABDOMEN: mildly tender upon palpation of the upper abdominal area, no guarding, no peritoneal signs, and nondistended. BS +. No masses. EXTREMITIES: Without any cyanosis, clubbing, rash, lesions or edema. NEUROLOGIC: AOx3, no focal motor deficit. SKIN: no jaundice, no rashes  Imaging/Labs: as  above  I personally reviewed and interpreted the available labs, imaging and endoscopic files.  Impression and Plan: Isaiah Ramos is a 45 y.o. male with past medical history of anxiety, depression, end-stage renal disease on dialysis, hypertension, gout, who presents for evaluation of abdominal pain.  The patient has presented intermittent symptoms of abdominal pain of unclear etiology.  It is possible that he is presenting some bowel hypersensitivity in the setting of end-stage renal disease and elevation of BUN.  Nevertheless, we will need to rule out any organic etiologies with an EGD, we will also proceed with an abdominal ultrasound as initial imaging modality.  The patient agree understood.  He will benefit from taking Bentyl as needed for episodes of abdominal pain if severe.  Finally, I advised the patient to proceed with colonoscopy on the same day as he is due for colorectal cancer screening.  Patient agreed and understood.  - Schedule EGD and colonoscopy - Can take Bentyl as needed  for abdominal pain episodes - Schedule abdominal  US  All questions were answered.      Harvel Quale, MD Gastroenterology and Hepatology [facility]

## 2021-06-04 NOTE — Telephone Encounter (Signed)
LeighAnn Starlin Steib, CMA  

## 2021-06-04 NOTE — Patient Instructions (Signed)
Schedule EGD and colonoscopy Can take Bentyl as needed for abdominal pain episodes Schedule abdominal  US

## 2021-06-04 NOTE — Telephone Encounter (Signed)
Isaiah Ramos, CMA  

## 2021-06-04 NOTE — Progress Notes (Signed)
Maylon Peppers, M.D. Gastroenterology & Hepatology [facility] [address] [place], [place] [address]  Primary Care Physician: Neale Burly, MD [place] [phone]  I will communicate my assessment and recommendations to the referring MD via EMR.  Problems: Abdominal pain  History of Present Illness: Isaiah Ramos is a 45 y.o. male with past medical history of anxiety, depression, end-stage renal disease on dialysis, hypertension, gout, who presents for evaluation of abdominal pain.  Patient reports that he has presented intermittent episodes of abdominal pain described as tightness that radiates to his RUQ. He has presented these episodes for the last 2 years. He noticed the pain is usually present after  he gets upset or excited. The pain usually lasts for a few minutes.  States he has these episodes 2 times a week on average.  The used to be very infrequent in the past but have become more frequent recently.  He does not take any medication for the pain. The patient denies having any nausea, vomiting, fever, chills, hematochezia, melena, heartburn, hematemesis, abdominal distention,  diarrhea, jaundice, pruritus. States he has been losing a fair amount of weight after starting dialysis - went from 230 to 190 in 4 months. States his appetite is down.  Overall, he feels that for the last month his weight has been somewhat stable as it only fluctuates a few pounds in between dialysis sessions.  No recent abdominal imaging has been performed. Last EGD: never Last Colonoscopy: never  Smokes 1/2 pack a day.  Past Medical History: Past Medical History:  Diagnosis Date   Anxiety    Arthritis    Gout   Cardiomegaly    Depression    Dyspnea on exertion    Fracture, foot    Hyperlipidemia    Hypertension    Hypertension    Kidney disease    Due to HTN, Stage 5   Plantar fasciitis    Pneumonia     Past  Surgical History: Past Surgical History:  Procedure Laterality Date   AV FISTULA PLACEMENT Right 02/09/2020   Procedure: RIGHT ARTERIOVENOUS FISTULA CREATION;  Surgeon: Serafina Mitchell, MD;  Location: [facility];  Service: Vascular;  Laterality: Right;   HERNIA REPAIR     PERIPHERAL VASCULAR BALLOON ANGIOPLASTY Right 04/10/2021   Procedure: PERIPHERAL VASCULAR BALLOON ANGIOPLASTY;  Surgeon: Serafina Mitchell, MD;  Location: [facility];  Service: Cardiovascular;  Laterality: Right;  Arm fistual   PYLOROPLASTY      Family History: Family History  Problem Relation Age of Onset   Hypertension Father    Diabetes Father    Hypertension Mother    Sarcoidosis Sister     Social History: Social History   Tobacco Use  Smoking Status Every Day   Packs/day: 0.50   Pack years: 0.00   Types: Cigarettes  Smokeless Tobacco Never   Social History   Substance and Sexual Activity  Alcohol Use Not Currently   Comment: rare   Social History   Substance and Sexual Activity  Drug Use Yes   Types: Marijuana    Allergies: No Known Allergies  Medications: Current Outpatient Medications  Medication Sig Dispense Refill   allopurinol (ZYLOPRIM) 100 MG tablet Take 100 mg by mouth in the morning.     bisoprolol-hydrochlorothiazide (ZIAC) 10-6.25 MG tablet Take 1 tablet by mouth in the morning.     calcitRIOL (ROCALTROL) 0.25 MCG capsule Take 0.25 mcg by mouth in  the morning.     calcium acetate (PHOSLO) 667 MG capsule Take 667 mg by mouth 2 (two) times daily with a meal.     cloNIDine (CATAPRES) 0.2 MG tablet Take 0.2 mg by mouth 2 (two) times daily.     dicyclomine (BENTYL) 10 MG capsule Take 1 capsule (10 mg total) by mouth every 12 (twelve) hours as needed (abdominal pain). 60 capsule 2   furosemide (LASIX) 20 MG tablet Take 20 mg by mouth in the morning.     hydrALAZINE (APRESOLINE) 50 MG tablet Take 50 mg by mouth 3 (three) times daily.     NIFEdipine (ADALAT CC) 90 MG 24 hr tablet Take  90 mg by mouth in the morning.     omeprazole (PRILOSEC) 20 MG capsule Take 20 mg by mouth in the morning.     sodium bicarbonate 650 MG tablet Take 650 mg by mouth 3 (three) times daily.     No current facility-administered medications for this visit.    Review of Systems: GENERAL: negative for malaise, night sweats HEENT: No changes in hearing or vision, no nose bleeds or other nasal problems. NECK: Negative for lumps, goiter, pain and significant neck swelling RESPIRATORY: Negative for cough, wheezing CARDIOVASCULAR: Negative for chest pain, leg swelling, palpitations, orthopnea GI: SEE HPI MUSCULOSKELETAL: Negative for joint pain or swelling, back pain, and muscle pain. SKIN: Negative for lesions, rash PSYCH: Negative for sleep disturbance, mood disorder and recent psychosocial stressors. HEMATOLOGY Negative for prolonged bleeding, bruising easily, and swollen nodes. ENDOCRINE: Negative for cold or heat intolerance, polyuria, polydipsia and goiter. NEURO: negative for tremor, gait imbalance, syncope and seizures. The remainder of the review of systems is noncontributory.   Physical Exam: BP 130/80 (BP Location: Right Arm, Patient Position: Sitting, Cuff Size: Large)   Pulse 77   Temp 98.8 F (37.1 C) (Oral)   Ht '6\' 5"'$  (1.956 m)   Wt 197 lb 14.4 oz (89.8 kg)   BMI 23.47 kg/m  GENERAL: The patient is AO x3, in no acute distress. HEENT: Head is normocephalic and atraumatic. EOMI are intact. Mouth is well hydrated and without lesions. NECK: Supple. No masses LUNGS: Clear to auscultation. No presence of rhonchi/wheezing/rales. Adequate chest expansion HEART: RRR, normal s1 and s2. ABDOMEN: mildly tender upon palpation of the upper abdominal area, no guarding, no peritoneal signs, and nondistended. BS +. No masses. EXTREMITIES: Without any cyanosis, clubbing, rash, lesions or edema. NEUROLOGIC: AOx3, no focal motor deficit. SKIN: no jaundice, no rashes  Imaging/Labs: as  above  I personally reviewed and interpreted the available labs, imaging and endoscopic files.  Impression and Plan: Isaiah Ramos is a 45 y.o. male with past medical history of anxiety, depression, end-stage renal disease on dialysis, hypertension, gout, who presents for evaluation of abdominal pain.  The patient has presented intermittent symptoms of abdominal pain of unclear etiology.  It is possible that he is presenting some bowel hypersensitivity in the setting of end-stage renal disease and elevation of BUN.  Nevertheless, we will need to rule out any organic etiologies with an EGD, we will also proceed with an abdominal ultrasound as initial imaging modality.  The patient agree understood.  He will benefit from taking Bentyl as needed for episodes of abdominal pain if severe.  Finally, I advised the patient to proceed with colonoscopy on the same day as he is due for colorectal cancer screening.  Patient agreed and understood.  - Schedule EGD and colonoscopy - Can take Bentyl as needed  for abdominal pain episodes - Schedule abdominal  US  All questions were answered.      Harvel Quale, MD Gastroenterology and Hepatology [facility]

## 2021-06-06 ENCOUNTER — Encounter (HOSPITAL_COMMUNITY)
Admission: RE | Admit: 2021-06-06 | Discharge: 2021-06-06 | Disposition: A | Discharge status: Home / Self Care | Payer: 59 | Source: Ambulatory Visit | Attending: Gastroenterology | Admitting: Gastroenterology

## 2021-06-06 ENCOUNTER — Other Ambulatory Visit: Payer: Self-pay

## 2021-06-06 ENCOUNTER — Encounter (HOSPITAL_COMMUNITY): Payer: Self-pay

## 2021-06-06 HISTORY — DX: Dependence on renal dialysis: Z99.2

## 2021-06-06 NOTE — Patient Instructions (Signed)
Isaiah Ramos  06/06/2021     '@PREFPERIOPPHARMACY'$ @   Your procedure is scheduled on 06/08/21.  Report to [name] [place] A[facility].  Call this number if you have problems the morning of surgery:  [phone]   Remember:      Follow prep instructions that were provided from the office.                Take these medicines the morning of surgery with A SIP OF WATER bisoprolol, prilosec, clonidine, hydralazine  & nifedipine.    Do not wear jewelry, make-up or nail polish.  Do not wear lotions, powders, or perfumes, or deodorant.  Do not shave 48 hours prior to surgery.  Men may shave face and neck.  Do not bring valuables to the hospital.  [facility] is not responsible for any belongings or valuables.  Contacts, dentures or bridgework may not be worn into surgery.  Leave your suitcase in the car.  After surgery it may be brought to your room.  For patients admitted to the hospital, discharge time will be determined by your treatment team.  Patients discharged the day of surgery will not be allowed to drive home.    Special instructions:  follow diet & prep instructions from office  Please read over the following fact sheets that you were given. Anesthesia Post-op Instructions and Care and Recovery After Surgery      Monitored Anesthesia Care, Care After This sheet gives you information about how to care for yourself after your procedure. Your health care provider may also give you more specific instructions. If you have problems or questions, contact your health careprovider. What can I expect after the procedure? After the procedure, it is common to have: Tiredness. Forgetfulness about what happened after the procedure. Impaired judgment for important decisions. Nausea or vomiting. Some difficulty with balance. Follow these instructions at home: For the time period you were told by your health care provider:     Rest as needed. Do not participate in activities where  you could fall or become injured. Do not drive or use machinery. Do not drink alcohol. Do not take sleeping pills or medicines that cause drowsiness. Do not make important decisions or sign legal documents. Do not take care of children on your own. Eating and drinking Follow the diet that is recommended by your health care provider. Drink enough fluid to keep your urine pale yellow. If you vomit: Drink water, juice, or soup when you can drink without vomiting. Make sure you have little or no nausea before eating solid foods. General instructions Have a responsible adult stay with you for the time you are told. It is important to have someone help care for you until you are awake and alert. Take over-the-counter and prescription medicines only as told by your health care provider. If you have sleep apnea, surgery and certain medicines can increase your risk for breathing problems. Follow instructions from your health care provider about wearing your sleep device: Anytime you are sleeping, including during daytime naps. While taking prescription pain medicines, sleeping medicines, or medicines that make you drowsy. Avoid smoking. Keep all follow-up visits as told by your health care provider. This is important. Contact a health care provider if: You keep feeling nauseous or you keep vomiting. You feel light-headed. You are still sleepy or having trouble with balance after 24 hours. You develop a rash. You have a fever. You have redness or swelling around the IV site.  Get help right away if: You have trouble breathing. You have new-onset confusion at home. Summary For several hours after your procedure, you may feel tired. You may also be forgetful and have poor judgment. Have a responsible adult stay with you for the time you are told. It is important to have someone help care for you until you are awake and alert. Rest as told. Do not drive or operate machinery. Do not drink alcohol  or take sleeping pills. Get help right away if you have trouble breathing, or if you suddenly become confused. This information is not intended to replace advice given to you by your health care provider. Make sure you discuss any questions you have with your healthcare provider. Document Revised: 08/10/2020 Document Reviewed: 10/28/2019 Elsevier Patient Education  2022 [place]. Upper Endoscopy, Adult Upper endoscopy is a procedure to look inside the upper GI (gastrointestinal) tract. The upper GI tract is made up of: The part of the body that moves food from your mouth to your stomach (esophagus). The stomach. The first part of your small intestine (duodenum). This procedure is also called esophagogastroduodenoscopy (EGD) or gastroscopy. In this procedure, your health care provider passes a thin, flexible tube (endoscope) through your mouth and down your esophagus into your stomach. A small camera is attached to the end of the tube. Images from the camera appear on a monitor in the exam room. During this procedure, your health care provider may also remove a small piece of tissue to be sent to a lab and examined under a microscope (biopsy). Your health care provider may do an upper endoscopy to diagnose cancers of the upper GI tract. You may also have this procedure to find the cause of other conditions, such as: Stomach pain. Heartburn. Pain or problems when swallowing. Nausea and vomiting. Stomach bleeding. Stomach ulcers. Tell a health care provider about: Any allergies you have. All medicines you are taking, including vitamins, herbs, eye drops, creams, and over-the-counter medicines. Any problems you or family members have had with anesthetic medicines. Any blood disorders you have. Any surgeries you have had. Any medical conditions you have. Whether you are pregnant or may be pregnant. What are the risks? Generally, this is a safe procedure. However, problems may occur,  including: Infection. Bleeding. Allergic reactions to medicines. A tear or hole (perforation) in the esophagus, stomach, or duodenum. What happens before the procedure? Staying hydrated Follow instructions from your health care provider about hydration, which may include: Up to 2 hours before the procedure - you may continue to drink clear liquids, such as water, clear fruit juice, black coffee, and plain tea.  Eating and drinking restrictions Follow instructions from your health care provider about eating and drinking, which may include: 8 hours before the procedure - stop eating heavy meals or foods, such as meat, fried foods, or fatty foods. 6 hours before the procedure - stop eating light meals or foods, such as toast or cereal. 6 hours before the procedure - stop drinking milk or drinks that contain milk. 2 hours before the procedure - stop drinking clear liquids. Medicines Ask your health care provider about: Changing or stopping your regular medicines. This is especially important if you are taking diabetes medicines or blood thinners. Taking medicines such as aspirin and ibuprofen. These medicines can thin your blood. Do not take these medicines unless your health care provider tells you to take them. Taking over-the-counter medicines, vitamins, herbs, and supplements. General instructions Plan to have someone take  you home from the hospital or clinic. If you will be going home right after the procedure, plan to have someone with you for 24 hours. Ask your health care provider what steps will be taken to help prevent infection. What happens during the procedure?  An IV will be inserted into one of your veins. You may be given one or more of the following: A medicine to help you relax (sedative). A medicine to numb the throat (local anesthetic). You will lie on your left side on an exam table. Your health care provider will pass the endoscope through your mouth and down your  esophagus. Your health care provider will use the scope to check the inside of your esophagus, stomach, and duodenum. Biopsies may be taken. The endoscope will be removed. The procedure may vary among health care providers and hospitals. What happens after the procedure? Your blood pressure, heart rate, breathing rate, and blood oxygen level will be monitored until you leave the hospital or clinic. Do not drive for 24 hours if you were given a sedative during your procedure. When your throat is no longer numb, you may be given some fluids to drink. It is up to you to get the results of your procedure. Ask your health care provider, or the department that is doing the procedure, when your results will be ready. Summary Upper endoscopy is a procedure to look inside the upper GI tract. During the procedure, an IV will be inserted into one of your veins. You may be given a medicine to help you relax. A medicine will be used to numb your throat. The endoscope will be passed through your mouth and down your esophagus. This information is not intended to replace advice given to you by your health care provider. Make sure you discuss any questions you have with your healthcare provider. Document Revised: 05/20/2018 Document Reviewed: 04/27/2018 Elsevier Patient Education  2022 [place]. Colonoscopy, Adult A colonoscopy is a procedure to look at the entire large intestine. This procedure is done using a long, thin, flexible tube that has a camera on theend. You may have a colonoscopy: As a part of normal colorectal screening. If you have certain symptoms, such as: A low number of red blood cells in your blood (anemia). Diarrhea that does not go away. Pain in your abdomen. Blood in your stool. A colonoscopy can help screen for and diagnose medical problems, including: Tumors. Extra tissue that grows where mucus forms (polyps). Inflammation. Areas of bleeding. Tell your health care provider  about: Any allergies you have. All medicines you are taking, including vitamins, herbs, eye drops, creams, and over-the-counter medicines. Any problems you or family members have had with anesthetic medicines. Any blood disorders you have. Any surgeries you have had. Any medical conditions you have. Any problems you have had with having bowel movements. Whether you are pregnant or may be pregnant. What are the risks? Generally, this is a safe procedure. However, problems may occur, including: Bleeding. Damage to your intestine. Allergic reactions to medicines given during the procedure. Infection. This is rare. What happens before the procedure? Eating and drinking restrictions Follow instructions from your health care provider about eating or drinking restrictions, which may include: A few days before the procedure: Follow a low-fiber diet. Avoid nuts, seeds, dried fruit, raw fruits, and vegetables. 1-3 days before the procedure: Eat only gelatin dessert or ice pops. Drink only clear liquids, such as water, clear juice, clear broth or bouillon, black coffee or tea,  or clear soft drinks or sports drinks. Avoid liquids that contain red or purple dye. The day of the procedure: Do not eat solid foods. You may continue to drink clear liquids until up to 2 hours before the procedure. Do not eat or drink anything starting 2 hours before the procedure, or within the time period that your health care provider recommends. Bowel prep If you were prescribed a bowel prep to take by mouth (orally) to clean out your colon: Take it as told by your health care provider. Starting the day before your procedure, you will need to drink a large amount of liquid medicine. The liquid will cause you to have many bowel movements of loose stool until your stool becomes almost clear or light green. If your skin or the opening between the buttocks (anus) gets irritated from diarrhea, you may relieve the  irritation using: Wipes with medicine in them, such as adult wet wipes with aloe and vitamin E. A product to soothe skin, such as petroleum jelly. If you vomit while drinking the bowel prep: Take a break for up to 60 minutes. Begin the bowel prep again. Call your health care provider if you keep vomiting or you cannot take the bowel prep without vomiting. To clean out your colon, you may also be given: Laxative medicines. These help you have a bowel movement. Instructions for enema use. An enema is liquid medicine injected into your rectum. Medicines Ask your health care provider about: Changing or stopping your regular medicines or supplements. This is especially important if you are taking iron supplements, diabetes medicines, or blood thinners. Taking medicines such as aspirin and ibuprofen. These medicines can thin your blood. Do not take these medicines unless your health care provider tells you to take them. Taking over-the-counter medicines, vitamins, herbs, and supplements. General instructions Ask your health care provider what steps will be taken to help prevent infection. These may include washing skin with a germ-killing soap. Plan to have someone take you home from the hospital or clinic. What happens during the procedure?  An IV will be inserted into one of your veins. You may be given one or more of the following: A medicine to help you relax (sedative). A medicine to numb the area (local anesthetic). A medicine to make you fall asleep (general anesthetic). This is rarely needed. You will lie on your side with your knees bent. The tube will: Have oil or gel put on it (be lubricated). Be inserted into your anus. Be gently eased through all parts of your large intestine. Air will be sent into your colon to keep it open. This may cause some pressure or cramping. Images will be taken with the camera and will appear on a screen. A small tissue sample may be removed to be  looked at under a microscope (biopsy). The tissue may be sent to a lab for testing if any signs of problems are found. If small polyps are found, they may be removed and checked for cancer cells. When the procedure is finished, the tube will be removed. The procedure may vary among health care providers and hospitals. What happens after the procedure? Your blood pressure, heart rate, breathing rate, and blood oxygen level will be monitored until you leave the hospital or clinic. You may have a small amount of blood in your stool. You may pass gas and have mild cramping or bloating in your abdomen. This is caused by the air that was used to open your colon  during the exam. Do not drive for 24 hours after the procedure. It is up to you to get the results of your procedure. Ask your health care provider, or the department that is doing the procedure, when your results will be ready. Summary A colonoscopy is a procedure to look at the entire large intestine. Follow instructions from your health care provider about eating and drinking before the procedure. If you were prescribed an oral bowel prep to clean out your colon, take it as told by your health care provider. During the colonoscopy, a flexible tube with a camera on its end is inserted into the anus and then passed into the other parts of the large intestine. This information is not intended to replace advice given to you by your health care provider. Make sure you discuss any questions you have with your healthcare provider. Document Revised: 06/18/2019 Document Reviewed: 06/18/2019 Elsevier Patient Education  [place].

## 2021-06-08 ENCOUNTER — Ambulatory Visit (HOSPITAL_COMMUNITY): Payer: 59 | Admitting: Anesthesiology

## 2021-06-08 ENCOUNTER — Ambulatory Visit (HOSPITAL_COMMUNITY)
Admission: RE | Admit: 2021-06-08 | Discharge: 2021-06-08 | Disposition: A | Discharge status: Home / Self Care | Payer: 59 | Source: Ambulatory Visit | Attending: Gastroenterology | Admitting: Gastroenterology

## 2021-06-08 ENCOUNTER — Encounter (HOSPITAL_COMMUNITY)
Admission: RE | Disposition: A | Discharge status: Home / Self Care | Payer: Self-pay | Source: Ambulatory Visit | Attending: Gastroenterology

## 2021-06-08 ENCOUNTER — Other Ambulatory Visit: Payer: Self-pay

## 2021-06-08 DIAGNOSIS — Z1211 Encounter for screening for malignant neoplasm of colon: Secondary | ICD-10-CM | POA: Diagnosis present

## 2021-06-08 DIAGNOSIS — F17210 Nicotine dependence, cigarettes, uncomplicated: Secondary | ICD-10-CM | POA: Diagnosis not present

## 2021-06-08 DIAGNOSIS — K319 Disease of stomach and duodenum, unspecified: Secondary | ICD-10-CM | POA: Insufficient documentation

## 2021-06-08 DIAGNOSIS — Z79899 Other long term (current) drug therapy: Secondary | ICD-10-CM | POA: Diagnosis not present

## 2021-06-08 DIAGNOSIS — K648 Other hemorrhoids: Secondary | ICD-10-CM | POA: Diagnosis not present

## 2021-06-08 DIAGNOSIS — N186 End stage renal disease: Secondary | ICD-10-CM | POA: Insufficient documentation

## 2021-06-08 DIAGNOSIS — Z992 Dependence on renal dialysis: Secondary | ICD-10-CM | POA: Insufficient documentation

## 2021-06-08 DIAGNOSIS — R1013 Epigastric pain: Secondary | ICD-10-CM

## 2021-06-08 DIAGNOSIS — I120 Hypertensive chronic kidney disease with stage 5 chronic kidney disease or end stage renal disease: Secondary | ICD-10-CM | POA: Diagnosis not present

## 2021-06-08 DIAGNOSIS — M109 Gout, unspecified: Secondary | ICD-10-CM | POA: Diagnosis not present

## 2021-06-08 DIAGNOSIS — K2980 Duodenitis without bleeding: Secondary | ICD-10-CM | POA: Diagnosis not present

## 2021-06-08 DIAGNOSIS — Z8249 Family history of ischemic heart disease and other diseases of the circulatory system: Secondary | ICD-10-CM | POA: Diagnosis not present

## 2021-06-08 DIAGNOSIS — K5730 Diverticulosis of large intestine without perforation or abscess without bleeding: Secondary | ICD-10-CM | POA: Diagnosis not present

## 2021-06-08 DIAGNOSIS — Z833 Family history of diabetes mellitus: Secondary | ICD-10-CM | POA: Insufficient documentation

## 2021-06-08 DIAGNOSIS — Z01812 Encounter for preprocedural laboratory examination: Secondary | ICD-10-CM

## 2021-06-08 HISTORY — PX: COLONOSCOPY WITH PROPOFOL: SHX5780

## 2021-06-08 HISTORY — PX: ESOPHAGOGASTRODUODENOSCOPY (EGD) WITH PROPOFOL: SHX5813

## 2021-06-08 HISTORY — PX: BIOPSY: SHX5522

## 2021-06-08 LAB — POCT I-STAT, CHEM 8
BUN: 32 mg/dL — ABNORMAL HIGH (ref 6–20)
Calcium, Ion: 1.11 mmol/L — ABNORMAL LOW (ref 1.15–1.40)
Chloride: 97 mmol/L — ABNORMAL LOW (ref 98–111)
Creatinine, Ser: 10.9 mg/dL — ABNORMAL HIGH (ref 0.61–1.24)
Glucose, Bld: 80 mg/dL (ref 70–99)
HCT: 45 % (ref 39.0–52.0)
Hemoglobin: 15.3 g/dL (ref 13.0–17.0)
Potassium: 5.1 mmol/L (ref 3.5–5.1)
Sodium: 137 mmol/L (ref 135–145)
TCO2: 31 mmol/L (ref 22–32)

## 2021-06-08 SURGERY — COLONOSCOPY WITH PROPOFOL
Anesthesia: General

## 2021-06-08 MED ORDER — PROPOFOL 10 MG/ML IV BOLUS
INTRAVENOUS | Status: DC | PRN
  Administered 2021-06-08: 100 mg via INTRAVENOUS
  Administered 2021-06-08: 150 ug/kg/min via INTRAVENOUS

## 2021-06-08 MED ORDER — LACTATED RINGERS IV SOLN: INTRAVENOUS | Status: DC

## 2021-06-08 MED ORDER — LIDOCAINE HCL (CARDIAC) PF 100 MG/5ML IV SOSY
PREFILLED_SYRINGE | INTRAVENOUS | Status: DC | PRN
  Administered 2021-06-08: 100 mg via INTRAVENOUS

## 2021-06-08 MED ORDER — SODIUM CHLORIDE 0.9 % IV SOLN: INTRAVENOUS | Status: DC

## 2021-06-08 MED ORDER — SODIUM CHLORIDE 0.9 % IV SOLN
INTRAVENOUS | Status: DC | PRN
  Administered 2021-06-08: 100 ug via INTRAVENOUS

## 2021-06-08 MED ORDER — OMEPRAZOLE 40 MG PO CPDR: 40.0000 mg | DELAYED_RELEASE_CAPSULE | Freq: Every day | ORAL | 3 refills | Status: AC

## 2021-06-08 NOTE — Op Note (Addendum)
[facility] Patient Name: Isaiah Ramos Procedure Date: 06/08/2021 10:18 AM MRN: [id number] Date of Birth: September 01, 1976 Attending MD: Maylon Peppers ,  CSN: [id number] Age: 45 Admit Type: Outpatient Procedure:                Upper GI endoscopy Indications:              Epigastric abdominal pain Providers:                Maylon Peppers, Janeece Riggers, RN, Nelma Rothman,                            Technician Referring MD:              Medicines:                Monitored Anesthesia Care Complications:            No immediate complications. Estimated Blood Loss:     Estimated blood loss: none. Procedure:                Pre-Anesthesia Assessment:                           - Prior to the procedure, a History and Physical                            was performed, and patient medications, allergies                            and sensitivities were reviewed. The patient's                            tolerance of previous anesthesia was reviewed.                           After obtaining informed consent, the endoscope was                            passed under direct vision. Throughout the                            procedure, the patient's blood pressure, pulse, and                            oxygen saturations were monitored continuously. The                            [id number] scope was introduced through the                            mouth, and advanced to the second part of duodenum.                            The upper GI endoscopy was accomplished without                            difficulty. The patient tolerated the procedure  well. Scope In: 10:26:11 AM Scope Out: 10:34:41 AM Total Procedure Duration: 0 hours 8 minutes 30 seconds  Findings:      The examined esophagus was normal.      Localized mildly erythematous mucosa without bleeding was found in the       gastric fundus. Biopsies were taken with a cold forceps for Helicobacter       pylori  testing.      A few localized small erosions with no stigmata of recent bleeding were       found in the gastric antrum. Biopsies were taken with a cold forceps for       Helicobacter pylori testing.      Diffuse moderate inflammation characterized by congestion (edema) and       erythema was found in the duodenal bulb. Impression:               - Normal esophagus.                           - Erythematous mucosa in the gastric fundus.                            Biopsied.                           - Erosive gastropathy with no stigmata of recent                            bleeding. Biopsied.                           - Duodenitis. Moderate Sedation:      Per Anesthesia Care Recommendation:           - Discharge patient to home (ambulatory).                           - Resume previous diet.                           - Await pathology results.                           - Increase omeprazole '40mg'$  daily. Procedure Code(s):        --- Professional ---                           [phone], Esophagogastroduodenoscopy, flexible,                            transoral; with biopsy, single or multiple Diagnosis Code(s):        --- Professional ---                           K31.89, Other diseases of stomach and duodenum                           K29.80, Duodenitis without bleeding  R10.13, Epigastric pain CPT copyright 2019 American Medical Association. All rights reserved. The codes documented in this report are preliminary and upon coder review may  be revised to meet current compliance requirements. Maylon Peppers, MD Maylon Peppers,  06/08/2021 10:39:02 AM This report has been signed electronically. Number of Addenda: 0

## 2021-06-08 NOTE — Discharge Instructions (Addendum)
You are being discharged to home.  Resume your previous diet.  We are waiting for your pathology results.  Increase omeprazole '40mg'$  daily.  Your physician has recommended a repeat colonoscopy in 10 years for screening purposes.

## 2021-06-08 NOTE — Op Note (Signed)
[facility] Patient Name: Isaiah Ramos Procedure Date: 06/08/2021 10:36 AM MRN: [id number] Date of Birth: February 04, 1976 Attending MD: Maylon Peppers ,  CSN: [id number] Age: 45 Admit Type: Outpatient Procedure:                Colonoscopy Indications:              Screening for colorectal malignant neoplasm Providers:                Maylon Peppers, Janeece Riggers, RN, Nelma Rothman,                            Technician Referring MD:              Medicines:                Monitored Anesthesia Care Complications:            No immediate complications. Estimated Blood Loss:     Estimated blood loss: none. Procedure:                Pre-Anesthesia Assessment:                           - Prior to the procedure, a History and Physical                            was performed, and patient medications, allergies                            and sensitivities were reviewed. The patient's                            tolerance of previous anesthesia was reviewed.                           After obtaining informed consent, the colonoscope                            was passed under direct vision. Throughout the                            procedure, the patient's blood pressure, pulse, and                            oxygen saturations were monitored continuously. The                            [id number] scope was introduced through                            the anus and advanced to the the cecum, identified                            by appendiceal orifice and ileocecal valve. The                            colonoscopy was performed without difficulty. The  patient tolerated the procedure well. The quality                            of the bowel preparation was adequate. Scope In: 10:40:21 AM Scope Out: 11:08:59 AM Scope Withdrawal Time: 0 hours 23 minutes 11 seconds  Total Procedure Duration: 0 hours 28 minutes 38 seconds  Findings:      The perianal and digital  rectal examinations were normal.      A few small and large-mouthed diverticula were found in the sigmoid       colon and descending colon.      Non-bleeding internal hemorrhoids were found during retroflexion. The       hemorrhoids were medium-sized. Impression:               - Diverticulosis in the sigmoid colon and in the                            descending colon.                           - Non-bleeding internal hemorrhoids.                           - No specimens collected. Moderate Sedation:      Per Anesthesia Care Recommendation:           - Discharge patient to home (ambulatory).                           - Resume previous diet.                           - Repeat colonoscopy in 10 years for screening                            purposes. Procedure Code(s):        --- Professional ---                           [id number], Colorectal cancer screening; colonoscopy on                            individual not meeting criteria for high risk Diagnosis Code(s):        --- Professional ---                           Z12.11, Encounter for screening for malignant                            neoplasm of colon                           K64.8, Other hemorrhoids                           K57.30, Diverticulosis of large intestine without  perforation or abscess without bleeding CPT copyright 2019 American Medical Association. All rights reserved. The codes documented in this report are preliminary and upon coder review may  be revised to meet current compliance requirements. Maylon Peppers, MD Maylon Peppers,  06/08/2021 11:12:26 AM This report has been signed electronically. Number of Addenda: 0

## 2021-06-08 NOTE — Transfer of Care (Signed)
Immediate Anesthesia Transfer of Care Note  Patient: Isaiah Ramos  Procedure(s) Performed: COLONOSCOPY WITH PROPOFOL ESOPHAGOGASTRODUODENOSCOPY (EGD) WITH PROPOFOL BIOPSY  Patient Location: Short Stay  Anesthesia Type:General  Level of Consciousness: awake, alert , oriented and patient cooperative  Airway & Oxygen Therapy: Patient Spontanous Breathing  Post-op Assessment: Report given to RN, Post -op Vital signs reviewed and stable and Patient moving all extremities  Post vital signs: Reviewed and stable  Last Vitals:  Vitals Value Taken Time  BP    Temp    Pulse    Resp    SpO2      Last Pain:  Vitals:   06/08/21 1000  PainSc: 0-No pain         Complications: No notable events documented.

## 2021-06-08 NOTE — Anesthesia Postprocedure Evaluation (Signed)
Anesthesia Post Note  Patient: Isaiah Ramos  Procedure(s) Performed: COLONOSCOPY WITH PROPOFOL ESOPHAGOGASTRODUODENOSCOPY (EGD) WITH PROPOFOL BIOPSY  Patient location during evaluation: PACU Anesthesia Type: General Level of consciousness: awake and alert and oriented Pain management: pain level controlled Vital Signs Assessment: post-procedure vital signs reviewed and stable Respiratory status: spontaneous breathing and respiratory function stable Cardiovascular status: blood pressure returned to baseline and stable Postop Assessment: no apparent nausea or vomiting Anesthetic complications: no   No notable events documented.   Last Vitals:  Vitals:   06/08/21 1112 06/08/21 1119  BP: 97/63 99/66  Pulse: 64   Resp: 16   Temp: 36.6 C   SpO2: 100%     Last Pain:  Vitals:   06/08/21 1112  TempSrc: Axillary  PainSc: 0-No pain                 Benedetta Sundstrom C Dajanique Robley

## 2021-06-08 NOTE — Interval H&P Note (Signed)
History and Physical Interval Note:  06/08/2021 9:54 AM  Isaiah Ramos is a 45 y.o. male with past medical history of anxiety, depression, end-stage renal disease on dialysis, hypertension, gout, who presents for evaluation of abdominal pain and CRC screening.  Has not presented any more abdominal episodes since last time seen in the clinic. The patient denies having any nausea, vomiting, fever, chills, hematochezia, melena, hematemesis, abdominal distention, abdominal pain, diarrhea, jaundice, pruritus or weight loss.  GENERAL: The patient is AO x3, in no acute distress. HEENT: Head is normocephalic and atraumatic. EOMI are intact. Mouth is well hydrated and without lesions. NECK: Supple. No masses LUNGS: Clear to auscultation. No presence of rhonchi/wheezing/rales. Adequate chest expansion HEART: RRR, normal s1 and s2. ABDOMEN: Soft, nontender, no guarding, no peritoneal signs, and nondistended. BS +. No masses. EXTREMITIES: Without any cyanosis, clubbing, rash, lesions or edema. NEUROLOGIC: AOx3, no focal motor deficit. SKIN: no jaundice, no rashes  [name]  has presented today for surgery, with the diagnosis of Screening Colonoscopy Abdominal Pain.  The various methods of treatment have been discussed with the patient and family. After consideration of risks, benefits and other options for treatment, the patient has consented to  Procedure(s) with comments: COLONOSCOPY WITH PROPOFOL (N/A) - 1:35 ESOPHAGOGASTRODUODENOSCOPY (EGD) WITH PROPOFOL (N/A) as a surgical intervention.  The patient's history has been reviewed, patient examined, no change in status, stable for surgery.  I have reviewed the patient's chart and labs.  Questions were answered to the patient's satisfaction.     [name]

## 2021-06-08 NOTE — Anesthesia Preprocedure Evaluation (Signed)
Anesthesia Evaluation  Patient identified by MRN, date of birth, ID band Patient awake    Reviewed: Allergy & Precautions, NPO status , Patient's Chart, lab work & pertinent test results, reviewed documented beta blocker date and time   History of Anesthesia Complications Negative for: history of anesthetic complications  Airway Mallampati: II  TM Distance: >3 FB Neck ROM: Full    Dental  (+) Dental Advisory Given, Teeth Intact   Pulmonary pneumonia, Current Smoker and Patient abstained from smoking.,    Pulmonary exam normal breath sounds clear to auscultation       Cardiovascular Exercise Tolerance: Good hypertension, Pt. on medications and Pt. on home beta blockers Normal cardiovascular exam Rhythm:Regular Rate:Normal     Neuro/Psych PSYCHIATRIC DISORDERS Anxiety Depression    GI/Hepatic negative GI ROS, Neg liver ROS,   Endo/Other  negative endocrine ROS  Renal/GU ESRF and DialysisRenal disease     Musculoskeletal  (+) Arthritis ,   Abdominal   Peds  Hematology negative hematology ROS (+)   Anesthesia Other Findings   Reproductive/Obstetrics                            Anesthesia Physical Anesthesia Plan  ASA: 3  Anesthesia Plan: General   Post-op Pain Management:    Induction: Intravenous  PONV Risk Score and Plan: Propofol infusion  Airway Management Planned: Natural Airway and Nasal Cannula  Additional Equipment:   Intra-op Plan:   Post-operative Plan: Extubation in OR  Informed Consent:     Dental advisory given  Plan Discussed with: CRNA and Surgeon  Anesthesia Plan Comments:         Anesthesia Quick Evaluation

## 2021-06-12 LAB — H. PYLORI ANTIBODY, IGG: H Pylori IgG: 0.53 Index Value (ref 0.00–0.79)

## 2021-06-12 LAB — SURGICAL PATHOLOGY

## 2021-06-19 ENCOUNTER — Encounter (HOSPITAL_COMMUNITY): Payer: Self-pay | Admitting: Gastroenterology

## 2021-07-23 ENCOUNTER — Ambulatory Visit (INDEPENDENT_AMBULATORY_CARE_PROVIDER_SITE_OTHER): Payer: 59 | Admitting: Gastroenterology

## 2021-09-18 ENCOUNTER — Other Ambulatory Visit (HOSPITAL_COMMUNITY): Payer: Self-pay | Admitting: Internal Medicine

## 2021-09-18 ENCOUNTER — Other Ambulatory Visit (HOSPITAL_BASED_OUTPATIENT_CLINIC_OR_DEPARTMENT_OTHER): Payer: Self-pay | Admitting: Internal Medicine

## 2021-09-18 DIAGNOSIS — R0789 Other chest pain: Secondary | ICD-10-CM

## 2021-09-18 DIAGNOSIS — R1084 Generalized abdominal pain: Secondary | ICD-10-CM

## 2021-09-19 ENCOUNTER — Other Ambulatory Visit (HOSPITAL_COMMUNITY): Payer: Self-pay | Admitting: Internal Medicine

## 2021-09-19 DIAGNOSIS — R0602 Shortness of breath: Secondary | ICD-10-CM

## 2021-09-21 ENCOUNTER — Ambulatory Visit (HOSPITAL_COMMUNITY)
Admission: RE | Admit: 2021-09-21 | Discharge: 2021-09-21 | Disposition: A | Discharge status: Home / Self Care | Payer: 59 | Source: Ambulatory Visit | Attending: Internal Medicine | Admitting: Internal Medicine

## 2021-09-21 ENCOUNTER — Other Ambulatory Visit: Payer: Self-pay

## 2021-09-21 DIAGNOSIS — I351 Nonrheumatic aortic (valve) insufficiency: Secondary | ICD-10-CM | POA: Diagnosis not present

## 2021-09-21 DIAGNOSIS — R0602 Shortness of breath: Secondary | ICD-10-CM | POA: Diagnosis present

## 2021-09-21 DIAGNOSIS — R1030 Lower abdominal pain, unspecified: Secondary | ICD-10-CM | POA: Insufficient documentation

## 2021-09-21 DIAGNOSIS — I10 Essential (primary) hypertension: Secondary | ICD-10-CM | POA: Diagnosis not present

## 2021-09-21 DIAGNOSIS — R361 Hematospermia: Secondary | ICD-10-CM | POA: Insufficient documentation

## 2021-09-21 DIAGNOSIS — R0600 Dyspnea, unspecified: Secondary | ICD-10-CM | POA: Diagnosis not present

## 2021-09-21 DIAGNOSIS — R0789 Other chest pain: Secondary | ICD-10-CM | POA: Insufficient documentation

## 2021-09-21 LAB — ECHOCARDIOGRAM COMPLETE
Area-P 1/2: 3.91 cm2
Calc EF: 65 %
S' Lateral: 3.5 cm
Single Plane A2C EF: 66.1 %
Single Plane A4C EF: 62 %

## 2021-09-21 NOTE — Progress Notes (Signed)
  Echocardiogram 2D Echocardiogram has been performed.  Isaiah Ramos 09/21/2021, 10:06 AM

## 2021-09-28 ENCOUNTER — Other Ambulatory Visit: Payer: Self-pay

## 2021-09-28 ENCOUNTER — Ambulatory Visit (HOSPITAL_COMMUNITY)
Admission: RE | Admit: 2021-09-28 | Discharge: 2021-09-28 | Disposition: A | Discharge status: Home / Self Care | Payer: 59 | Source: Ambulatory Visit | Attending: Internal Medicine | Admitting: Internal Medicine

## 2021-09-28 DIAGNOSIS — R0789 Other chest pain: Secondary | ICD-10-CM | POA: Insufficient documentation

## 2021-09-28 DIAGNOSIS — R1084 Generalized abdominal pain: Secondary | ICD-10-CM | POA: Diagnosis not present

## 2021-09-28 MED ORDER — IOHEXOL 350 MG/ML SOLN
80.0000 mL | Freq: Once | INTRAVENOUS | Status: AC | PRN
  Administered 2021-09-28: 80 mL via INTRAVENOUS

## 2021-10-25 NOTE — Progress Notes (Signed)
Cardiology Office Note:    Date:  10/26/2021   ID:  Isaiah Ramos, DOB 1976/07/28, MRN [id number]  PCP:  Neale Burly, MD  Cardiologist:  None  Electrophysiologist:  None   Referring MD: Neale Burly, MD   Chief Complaint  Patient presents with   Pre-op Exam    History of Present Illness:    Isaiah Ramos is a 45 y.o. male with a hx of ESRD, hypertension, hyperlipidemia who is referred by Dr. Sherrie Sport for preop evaluation prior to kidney transplant.  Needs stress test prior to transplant.  Echocardiogram 09/21/2021 showed EF 50 to 55%, severe LVH, grade 3 diastolic dysfunction, normal RV function, severe left atrial dilatation, mild AI.  He reports left-sided chest pain, has been occurring about once every 2 weeks.  Has not noted relationship with exertion.  Typically lasts for few minutes when it occurs.  Describes as tightness.  Also with intermittent shortness of breath.  He reports he has had intermittent lightheadedness but denies any syncope.  Denies lower extremity edema.  He is smoking 0.25 packs/day currently.  Reports no known family history of heart disease.    Past Medical History:  Diagnosis Date   Anxiety    Arthritis    Gout   Cardiomegaly    Depression    Dialysis patient ([place])    Dyspnea on exertion    Fracture, foot    Hyperlipidemia    Hypertension    Hypertension    Kidney disease    Due to HTN, Stage 5   Plantar fasciitis    Pneumonia     Past Surgical History:  Procedure Laterality Date   AV FISTULA PLACEMENT Right 02/09/2020   Procedure: RIGHT ARTERIOVENOUS FISTULA CREATION;  Surgeon: Serafina Mitchell, MD;  Location: [facility];  Service: Vascular;  Laterality: Right;   BIOPSY  06/08/2021   Procedure: BIOPSY;  Surgeon: Harvel Quale, MD;  [facility];  Service: Gastroenterology;;   COLONOSCOPY WITH PROPOFOL N/A 06/08/2021   Procedure: COLONOSCOPY WITH PROPOFOL;  Surgeon: Harvel Quale, MD;  [facility];  Service: Gastroenterology;  Laterality: N/A;  1:35   ESOPHAGOGASTRODUODENOSCOPY (EGD) WITH PROPOFOL N/A 06/08/2021   Procedure: ESOPHAGOGASTRODUODENOSCOPY (EGD) WITH PROPOFOL;  Surgeon: Harvel Quale, MD;  [facility];  Service: Gastroenterology;  Laterality: N/A;   HERNIA REPAIR     PERIPHERAL VASCULAR BALLOON ANGIOPLASTY Right 04/10/2021   Procedure: PERIPHERAL VASCULAR BALLOON ANGIOPLASTY;  Surgeon: Serafina Mitchell, MD;  Location: [facility];  Service: Cardiovascular;  Laterality: Right;  Arm fistual   PYLOROPLASTY      Current Medications: Current Meds  Medication Sig   allopurinol (ZYLOPRIM) 100 MG tablet Take 100 mg by mouth in the morning.   bisoprolol-hydrochlorothiazide (ZIAC) 10-6.25 MG tablet Take 1 tablet by mouth in the morning.   calcitRIOL (ROCALTROL) 0.25 MCG capsule Take 0.25 mcg by mouth in the morning.   calcium acetate (PHOSLO) 667 MG capsule Take 667 mg by mouth 2 (two) times daily with a meal.   cloNIDine (CATAPRES) 0.2 MG tablet Take 0.2 mg by mouth 2 (two) times daily.   dicyclomine (BENTYL) 10 MG capsule Take 1 capsule (10 mg total) by mouth every 12 (twelve) hours as needed (abdominal pain).   furosemide (LASIX) 20 MG tablet Take 20 mg by mouth in the morning.   hydrALAZINE (APRESOLINE) 50 MG tablet Take 50 mg by mouth 3 (three) times daily.   NIFEdipine (ADALAT CC)  90 MG 24 hr tablet Take 90 mg by mouth in the morning.   omeprazole (PRILOSEC) 40 MG capsule Take 1 capsule (40 mg total) by mouth daily.   sodium bicarbonate 650 MG tablet Take 650 mg by mouth 3 (three) times daily.     Allergies:   Patient has no known allergies.   Social History   Socioeconomic History   Marital status: Married    Spouse name: Not on file   Number of children: Not on file   Years of education: Not on file   Highest education level: Not on file  Occupational History   Occupation: Unemployed    Employer: UNEMPLOYED  Tobacco Use    Smoking status: Some Days    Packs/day: 0.50    Types: Cigarettes   Smokeless tobacco: Never  Vaping Use   Vaping Use: Never used  Substance and Sexual Activity   Alcohol use: Not Currently    Comment: rare   Drug use: Not Currently    Types: Marijuana   Sexual activity: Not on file  Other Topics Concern   Not on file  Social History Narrative   Domestic partner   Social Determinants of Health   Financial Resource Strain: Not on file  Food Insecurity: Not on file  Transportation Needs: Not on file  Physical Activity: Not on file  Stress: Not on file  Social Connections: Not on file     Family History: The patient's family history includes Diabetes in his father; Hypertension in his father and mother; Sarcoidosis in his sister.  ROS:   Please see the history of present illness.     All other systems reviewed and are negative.  EKGs/Labs/Other Studies Reviewed:    The following studies were reviewed today:   EKG:  EKG is not ordered today.    Recent Labs: 06/08/2021: BUN 32; Creatinine, Ser 10.90; Hemoglobin 15.3; Potassium 5.1; Sodium 137  Recent Lipid Panel No results found for: CHOL, TRIG, HDL, CHOLHDL, VLDL, LDLCALC, LDLDIRECT  Physical Exam:    VS:  BP 130/78   Pulse 72   Ht '6\' 4"'  (1.93 m)   Wt 194 lb 6.4 oz (88.2 kg)   SpO2 100%   BMI 23.66 kg/m     Wt Readings from Last 3 Encounters:  10/26/21 194 lb 6.4 oz (88.2 kg)  06/06/21 196 lb (88.9 kg)  06/04/21 197 lb 14.4 oz (89.8 kg)     GEN:  Well nourished, well developed in no acute distress HEENT: Normal NECK: No JVD; No carotid bruits LYMPHATICS: No lymphadenopathy CARDIAC: RRR, no murmurs, rubs, gallops RESPIRATORY:  Clear to auscultation without rales, wheezing or rhonchi  ABDOMEN: Soft, non-tender, non-distended MUSCULOSKELETAL:  No edema; No deformity  SKIN: Warm and dry NEUROLOGIC:  Alert and oriented x 3 PSYCHIATRIC:  Normal affect   ASSESSMENT:    1. Chest pain, unspecified type    2. Pre-op evaluation   3. LVH (left ventricular hypertrophy)   4. Essential hypertension   5. Tobacco use    PLAN:    Preop evaluation: Prior to kidney transplant.  Needs stress test prior to being listed.  He does report atypical chest pain. -Lexiscan Myoview  LVH: Echocardiogram 09/21/2021 showed EF 50 to 55%, severe LVH, grade 3 diastolic dysfunction, normal RV function, severe left atrial dilatation, mild AI.  LVH likely secondary to poorly controlled hypertension.  Not a candidate for MRI given renal function.  Will check light chains/SPEP, PYP scan to rule out amyloidosis  ESRD:  On HD, being evaluated for renal transplant  Hypertension: On nifedipine 90 mg daily, Lasix 20 mg daily, hydralazine 50 mg 3 times daily, clonidine 0.2 mg twice daily, bisoprolol-HCTZ 10-6.25 mg daily.  Appears controlled  Tobacco use: Cessation strongly recommended  RTC in 6 months   Medication Adjustments/Labs and Tests Ordered: Current medicines are reviewed at length with the patient today.  Concerns regarding medicines are outlined above.  Orders Placed This Encounter  Procedures   NM CARDIAC AMYLOID TUMOR LOC INFLAM SPECT 1 DAY   NM Myocar Multi W/Spect W/Wall Motion / EF   Multiple Myeloma Panel (SPEP&IFE w/QIG)   No orders of the defined types were placed in this encounter.   Patient Instructions  Medication Instructions:  Your physician recommends that you continue on your current medications as directed. Please refer to the Current Medication list given to you today.   Labwork:  Multiple Myeloma panel today  Testing/Procedures:  PYP scan at [facility] has requested that you have a lexiscan myoview at [facility]. For further information please visit [email or web address]. Please follow instruction sheet, as given.    Follow-Up: 6 months with MD  Any Other Special Instructions Will Be Listed Below (If Applicable).  If you need a refill  on your cardiac medications before your next appointment, please call your pharmacy.   Signed, Donato Heinz, MD  10/26/2021 9:46 AM    [place]

## 2021-10-26 ENCOUNTER — Encounter: Payer: Self-pay | Admitting: Cardiology

## 2021-10-26 ENCOUNTER — Other Ambulatory Visit: Payer: Self-pay

## 2021-10-26 ENCOUNTER — Ambulatory Visit (INDEPENDENT_AMBULATORY_CARE_PROVIDER_SITE_OTHER): Payer: 59 | Admitting: Cardiology

## 2021-10-26 ENCOUNTER — Other Ambulatory Visit (HOSPITAL_COMMUNITY)
Admission: RE | Admit: 2021-10-26 | Discharge: 2021-10-26 | Disposition: A | Discharge status: Home / Self Care | Payer: 59 | Source: Ambulatory Visit | Attending: Cardiology | Admitting: Cardiology

## 2021-10-26 VITALS — BP 130/78 | HR 72 | Ht 76.0 in | Wt 194.4 lb

## 2021-10-26 DIAGNOSIS — Z01810 Encounter for preprocedural cardiovascular examination: Secondary | ICD-10-CM | POA: Diagnosis not present

## 2021-10-26 DIAGNOSIS — Z720 Tobacco use: Secondary | ICD-10-CM

## 2021-10-26 DIAGNOSIS — I517 Cardiomegaly: Secondary | ICD-10-CM

## 2021-10-26 DIAGNOSIS — R079 Chest pain, unspecified: Secondary | ICD-10-CM | POA: Diagnosis present

## 2021-10-26 DIAGNOSIS — I10 Essential (primary) hypertension: Secondary | ICD-10-CM

## 2021-10-26 DIAGNOSIS — Z01818 Encounter for other preprocedural examination: Secondary | ICD-10-CM

## 2021-10-26 NOTE — Patient Instructions (Signed)
Medication Instructions:  Your physician recommends that you continue on your current medications as directed. Please refer to the Current Medication list given to you today.   Labwork:  Multiple Myeloma panel today  Testing/Procedures:  PYP scan at [facility] has requested that you have a lexiscan myoview at [facility]. For further information please visit [email or web address]. Please follow instruction sheet, as given.    Follow-Up: 6 months with MD  Any Other Special Instructions Will Be Listed Below (If Applicable).  If you need a refill on your cardiac medications before your next appointment, please call your pharmacy.

## 2021-10-30 LAB — MULTIPLE MYELOMA PANEL, SERUM
Albumin SerPl Elph-Mcnc: 4 g/dL (ref 2.9–4.4)
Albumin/Glob SerPl: 1.3 (ref 0.7–1.7)
Alpha 1: 0.2 g/dL (ref 0.0–0.4)
Alpha2 Glob SerPl Elph-Mcnc: 0.6 g/dL (ref 0.4–1.0)
B-Globulin SerPl Elph-Mcnc: 0.8 g/dL (ref 0.7–1.3)
Gamma Glob SerPl Elph-Mcnc: 1.4 g/dL (ref 0.4–1.8)
Globulin, Total: 3.1 g/dL (ref 2.2–3.9)
IgA: 225 mg/dL (ref 90–386)
IgG (Immunoglobin G), Serum: 1357 mg/dL (ref 603–1613)
IgM (Immunoglobulin M), Srm: 28 mg/dL (ref 20–172)
Total Protein ELP: 7.1 g/dL (ref 6.0–8.5)

## 2021-11-08 ENCOUNTER — Encounter (HOSPITAL_COMMUNITY): Payer: 59

## 2021-11-16 ENCOUNTER — Ambulatory Visit (HOSPITAL_COMMUNITY)
Admission: RE | Admit: 2021-11-16 | Discharge: 2021-11-16 | Disposition: A | Discharge status: Home / Self Care | Payer: 59 | Source: Ambulatory Visit | Attending: Cardiology | Admitting: Cardiology

## 2021-11-16 ENCOUNTER — Encounter (HOSPITAL_COMMUNITY)
Admission: RE | Admit: 2021-11-16 | Discharge: 2021-11-16 | Disposition: A | Discharge status: Home / Self Care | Payer: 59 | Source: Ambulatory Visit | Attending: Cardiology | Admitting: Cardiology

## 2021-11-16 ENCOUNTER — Other Ambulatory Visit: Payer: Self-pay

## 2021-11-16 ENCOUNTER — Encounter (HOSPITAL_COMMUNITY): Payer: Self-pay

## 2021-11-16 DIAGNOSIS — R079 Chest pain, unspecified: Secondary | ICD-10-CM

## 2021-11-16 HISTORY — DX: Disorder of kidney and ureter, unspecified: N28.9

## 2021-11-16 LAB — NM MYOCAR MULTI W/SPECT W/WALL MOTION / EF
LV dias vol: 213 mL (ref 62–150)
LV sys vol: 116 mL
Nuc Stress EF: 45 %
Peak HR: 94 {beats}/min
RATE: 0.4
Rest HR: 68 {beats}/min
Rest Nuclear Isotope Dose: 10.3 mCi
SDS: 0
SRS: 10
SSS: 10
ST Depression (mm): 0 mm
Stress Nuclear Isotope Dose: 33 mCi
TID: 1.1

## 2021-11-16 MED ORDER — SODIUM CHLORIDE FLUSH 0.9 % IV SOLN
INTRAVENOUS | Status: AC
  Administered 2021-11-16: 10 mL via INTRAVENOUS
  Filled 2021-11-16: qty 10

## 2021-11-16 MED ORDER — REGADENOSON 0.4 MG/5ML IV SOLN
INTRAVENOUS | Status: AC
  Administered 2021-11-16: 0.4 mg via INTRAVENOUS
  Filled 2021-11-16: qty 5

## 2021-11-16 MED ORDER — TECHNETIUM TC 99M TETROFOSMIN IV KIT
30.0000 | PACK | Freq: Once | INTRAVENOUS | Status: AC | PRN
  Administered 2021-11-16: 33 via INTRAVENOUS

## 2021-11-16 MED ORDER — TECHNETIUM TC 99M TETROFOSMIN IV KIT
10.0000 | PACK | Freq: Once | INTRAVENOUS | Status: AC | PRN
  Administered 2021-11-16: 10.3 via INTRAVENOUS

## 2021-11-19 ENCOUNTER — Encounter: Payer: Self-pay | Admitting: *Deleted

## 2021-12-11 DIAGNOSIS — N186 End stage renal disease: Secondary | ICD-10-CM | POA: Diagnosis not present

## 2021-12-11 DIAGNOSIS — Z992 Dependence on renal dialysis: Secondary | ICD-10-CM | POA: Diagnosis not present

## 2021-12-11 DIAGNOSIS — N2581 Secondary hyperparathyroidism of renal origin: Secondary | ICD-10-CM | POA: Diagnosis not present

## 2021-12-13 DIAGNOSIS — N2581 Secondary hyperparathyroidism of renal origin: Secondary | ICD-10-CM | POA: Diagnosis not present

## 2021-12-13 DIAGNOSIS — Z992 Dependence on renal dialysis: Secondary | ICD-10-CM | POA: Diagnosis not present

## 2021-12-13 DIAGNOSIS — N186 End stage renal disease: Secondary | ICD-10-CM | POA: Diagnosis not present

## 2021-12-15 DIAGNOSIS — N2581 Secondary hyperparathyroidism of renal origin: Secondary | ICD-10-CM | POA: Diagnosis not present

## 2021-12-15 DIAGNOSIS — Z992 Dependence on renal dialysis: Secondary | ICD-10-CM | POA: Diagnosis not present

## 2021-12-15 DIAGNOSIS — N186 End stage renal disease: Secondary | ICD-10-CM | POA: Diagnosis not present

## 2021-12-18 DIAGNOSIS — N2581 Secondary hyperparathyroidism of renal origin: Secondary | ICD-10-CM | POA: Diagnosis not present

## 2021-12-18 DIAGNOSIS — Z992 Dependence on renal dialysis: Secondary | ICD-10-CM | POA: Diagnosis not present

## 2021-12-18 DIAGNOSIS — N186 End stage renal disease: Secondary | ICD-10-CM | POA: Diagnosis not present

## 2021-12-20 DIAGNOSIS — N2581 Secondary hyperparathyroidism of renal origin: Secondary | ICD-10-CM | POA: Diagnosis not present

## 2021-12-20 DIAGNOSIS — Z992 Dependence on renal dialysis: Secondary | ICD-10-CM | POA: Diagnosis not present

## 2021-12-20 DIAGNOSIS — N186 End stage renal disease: Secondary | ICD-10-CM | POA: Diagnosis not present

## 2021-12-22 DIAGNOSIS — N186 End stage renal disease: Secondary | ICD-10-CM | POA: Diagnosis not present

## 2021-12-22 DIAGNOSIS — N2581 Secondary hyperparathyroidism of renal origin: Secondary | ICD-10-CM | POA: Diagnosis not present

## 2021-12-22 DIAGNOSIS — Z992 Dependence on renal dialysis: Secondary | ICD-10-CM | POA: Diagnosis not present

## 2021-12-24 DIAGNOSIS — E213 Hyperparathyroidism, unspecified: Secondary | ICD-10-CM | POA: Diagnosis not present

## 2021-12-24 DIAGNOSIS — I700 Atherosclerosis of aorta: Secondary | ICD-10-CM | POA: Diagnosis not present

## 2021-12-24 DIAGNOSIS — I10 Essential (primary) hypertension: Secondary | ICD-10-CM | POA: Diagnosis not present

## 2021-12-24 DIAGNOSIS — N185 Chronic kidney disease, stage 5: Secondary | ICD-10-CM | POA: Diagnosis not present

## 2021-12-24 DIAGNOSIS — L918 Other hypertrophic disorders of the skin: Secondary | ICD-10-CM | POA: Diagnosis not present

## 2021-12-25 DIAGNOSIS — I259 Chronic ischemic heart disease, unspecified: Secondary | ICD-10-CM | POA: Diagnosis not present

## 2021-12-25 DIAGNOSIS — N186 End stage renal disease: Secondary | ICD-10-CM | POA: Diagnosis not present

## 2021-12-25 DIAGNOSIS — Z992 Dependence on renal dialysis: Secondary | ICD-10-CM | POA: Diagnosis not present

## 2021-12-25 DIAGNOSIS — N2581 Secondary hyperparathyroidism of renal origin: Secondary | ICD-10-CM | POA: Diagnosis not present

## 2021-12-27 DIAGNOSIS — L579 Skin changes due to chronic exposure to nonionizing radiation, unspecified: Secondary | ICD-10-CM | POA: Diagnosis not present

## 2021-12-27 DIAGNOSIS — N2581 Secondary hyperparathyroidism of renal origin: Secondary | ICD-10-CM | POA: Diagnosis not present

## 2021-12-27 DIAGNOSIS — N186 End stage renal disease: Secondary | ICD-10-CM | POA: Diagnosis not present

## 2021-12-27 DIAGNOSIS — L720 Epidermal cyst: Secondary | ICD-10-CM | POA: Diagnosis not present

## 2021-12-27 DIAGNOSIS — L570 Actinic keratosis: Secondary | ICD-10-CM | POA: Diagnosis not present

## 2021-12-27 DIAGNOSIS — Z992 Dependence on renal dialysis: Secondary | ICD-10-CM | POA: Diagnosis not present

## 2021-12-29 DIAGNOSIS — N2581 Secondary hyperparathyroidism of renal origin: Secondary | ICD-10-CM | POA: Diagnosis not present

## 2021-12-29 DIAGNOSIS — N186 End stage renal disease: Secondary | ICD-10-CM | POA: Diagnosis not present

## 2021-12-29 DIAGNOSIS — Z992 Dependence on renal dialysis: Secondary | ICD-10-CM | POA: Diagnosis not present

## 2022-01-01 DIAGNOSIS — N2581 Secondary hyperparathyroidism of renal origin: Secondary | ICD-10-CM | POA: Diagnosis not present

## 2022-01-01 DIAGNOSIS — Z992 Dependence on renal dialysis: Secondary | ICD-10-CM | POA: Diagnosis not present

## 2022-01-01 DIAGNOSIS — N186 End stage renal disease: Secondary | ICD-10-CM | POA: Diagnosis not present

## 2022-01-03 DIAGNOSIS — D509 Iron deficiency anemia, unspecified: Secondary | ICD-10-CM | POA: Diagnosis not present

## 2022-01-03 DIAGNOSIS — N2581 Secondary hyperparathyroidism of renal origin: Secondary | ICD-10-CM | POA: Diagnosis not present

## 2022-01-03 DIAGNOSIS — Z992 Dependence on renal dialysis: Secondary | ICD-10-CM | POA: Diagnosis not present

## 2022-01-03 DIAGNOSIS — N186 End stage renal disease: Secondary | ICD-10-CM | POA: Diagnosis not present

## 2022-01-05 DIAGNOSIS — Z992 Dependence on renal dialysis: Secondary | ICD-10-CM | POA: Diagnosis not present

## 2022-01-05 DIAGNOSIS — N186 End stage renal disease: Secondary | ICD-10-CM | POA: Diagnosis not present

## 2022-01-05 DIAGNOSIS — N2581 Secondary hyperparathyroidism of renal origin: Secondary | ICD-10-CM | POA: Diagnosis not present

## 2022-01-08 DIAGNOSIS — Z992 Dependence on renal dialysis: Secondary | ICD-10-CM | POA: Diagnosis not present

## 2022-01-08 DIAGNOSIS — N2581 Secondary hyperparathyroidism of renal origin: Secondary | ICD-10-CM | POA: Diagnosis not present

## 2022-01-08 DIAGNOSIS — N186 End stage renal disease: Secondary | ICD-10-CM | POA: Diagnosis not present

## 2022-01-10 DIAGNOSIS — N186 End stage renal disease: Secondary | ICD-10-CM | POA: Diagnosis not present

## 2022-01-10 DIAGNOSIS — Z992 Dependence on renal dialysis: Secondary | ICD-10-CM | POA: Diagnosis not present

## 2022-01-10 DIAGNOSIS — N2581 Secondary hyperparathyroidism of renal origin: Secondary | ICD-10-CM | POA: Diagnosis not present

## 2022-01-12 DIAGNOSIS — N2581 Secondary hyperparathyroidism of renal origin: Secondary | ICD-10-CM | POA: Diagnosis not present

## 2022-01-12 DIAGNOSIS — N186 End stage renal disease: Secondary | ICD-10-CM | POA: Diagnosis not present

## 2022-01-12 DIAGNOSIS — Z992 Dependence on renal dialysis: Secondary | ICD-10-CM | POA: Diagnosis not present

## 2022-01-15 DIAGNOSIS — Z992 Dependence on renal dialysis: Secondary | ICD-10-CM | POA: Diagnosis not present

## 2022-01-15 DIAGNOSIS — N2581 Secondary hyperparathyroidism of renal origin: Secondary | ICD-10-CM | POA: Diagnosis not present

## 2022-01-15 DIAGNOSIS — N186 End stage renal disease: Secondary | ICD-10-CM | POA: Diagnosis not present

## 2022-01-17 DIAGNOSIS — N186 End stage renal disease: Secondary | ICD-10-CM | POA: Diagnosis not present

## 2022-01-17 DIAGNOSIS — Z992 Dependence on renal dialysis: Secondary | ICD-10-CM | POA: Diagnosis not present

## 2022-01-17 DIAGNOSIS — N2581 Secondary hyperparathyroidism of renal origin: Secondary | ICD-10-CM | POA: Diagnosis not present

## 2022-01-19 DIAGNOSIS — Z992 Dependence on renal dialysis: Secondary | ICD-10-CM | POA: Diagnosis not present

## 2022-01-19 DIAGNOSIS — N186 End stage renal disease: Secondary | ICD-10-CM | POA: Diagnosis not present

## 2022-01-19 DIAGNOSIS — N2581 Secondary hyperparathyroidism of renal origin: Secondary | ICD-10-CM | POA: Diagnosis not present

## 2022-01-22 DIAGNOSIS — N2581 Secondary hyperparathyroidism of renal origin: Secondary | ICD-10-CM | POA: Diagnosis not present

## 2022-01-22 DIAGNOSIS — Z992 Dependence on renal dialysis: Secondary | ICD-10-CM | POA: Diagnosis not present

## 2022-01-22 DIAGNOSIS — N186 End stage renal disease: Secondary | ICD-10-CM | POA: Diagnosis not present

## 2022-01-24 DIAGNOSIS — N2581 Secondary hyperparathyroidism of renal origin: Secondary | ICD-10-CM | POA: Diagnosis not present

## 2022-01-24 DIAGNOSIS — N186 End stage renal disease: Secondary | ICD-10-CM | POA: Diagnosis not present

## 2022-01-24 DIAGNOSIS — Z992 Dependence on renal dialysis: Secondary | ICD-10-CM | POA: Diagnosis not present

## 2022-01-26 DIAGNOSIS — N186 End stage renal disease: Secondary | ICD-10-CM | POA: Diagnosis not present

## 2022-01-26 DIAGNOSIS — N2581 Secondary hyperparathyroidism of renal origin: Secondary | ICD-10-CM | POA: Diagnosis not present

## 2022-01-26 DIAGNOSIS — Z992 Dependence on renal dialysis: Secondary | ICD-10-CM | POA: Diagnosis not present

## 2022-01-29 DIAGNOSIS — N2581 Secondary hyperparathyroidism of renal origin: Secondary | ICD-10-CM | POA: Diagnosis not present

## 2022-01-29 DIAGNOSIS — Z992 Dependence on renal dialysis: Secondary | ICD-10-CM | POA: Diagnosis not present

## 2022-01-29 DIAGNOSIS — N186 End stage renal disease: Secondary | ICD-10-CM | POA: Diagnosis not present

## 2022-01-31 DIAGNOSIS — N2581 Secondary hyperparathyroidism of renal origin: Secondary | ICD-10-CM | POA: Diagnosis not present

## 2022-01-31 DIAGNOSIS — N186 End stage renal disease: Secondary | ICD-10-CM | POA: Diagnosis not present

## 2022-01-31 DIAGNOSIS — Z992 Dependence on renal dialysis: Secondary | ICD-10-CM | POA: Diagnosis not present

## 2022-02-02 DIAGNOSIS — N186 End stage renal disease: Secondary | ICD-10-CM | POA: Diagnosis not present

## 2022-02-02 DIAGNOSIS — N2581 Secondary hyperparathyroidism of renal origin: Secondary | ICD-10-CM | POA: Diagnosis not present

## 2022-02-02 DIAGNOSIS — Z992 Dependence on renal dialysis: Secondary | ICD-10-CM | POA: Diagnosis not present

## 2022-02-05 DIAGNOSIS — N2581 Secondary hyperparathyroidism of renal origin: Secondary | ICD-10-CM | POA: Diagnosis not present

## 2022-02-05 DIAGNOSIS — N186 End stage renal disease: Secondary | ICD-10-CM | POA: Diagnosis not present

## 2022-02-05 DIAGNOSIS — Z992 Dependence on renal dialysis: Secondary | ICD-10-CM | POA: Diagnosis not present

## 2022-02-07 DIAGNOSIS — N186 End stage renal disease: Secondary | ICD-10-CM | POA: Diagnosis not present

## 2022-02-07 DIAGNOSIS — N2581 Secondary hyperparathyroidism of renal origin: Secondary | ICD-10-CM | POA: Diagnosis not present

## 2022-02-07 DIAGNOSIS — Z992 Dependence on renal dialysis: Secondary | ICD-10-CM | POA: Diagnosis not present

## 2022-02-09 DIAGNOSIS — N186 End stage renal disease: Secondary | ICD-10-CM | POA: Diagnosis not present

## 2022-02-09 DIAGNOSIS — Z992 Dependence on renal dialysis: Secondary | ICD-10-CM | POA: Diagnosis not present

## 2022-02-09 DIAGNOSIS — N2581 Secondary hyperparathyroidism of renal origin: Secondary | ICD-10-CM | POA: Diagnosis not present

## 2022-02-12 DIAGNOSIS — N2581 Secondary hyperparathyroidism of renal origin: Secondary | ICD-10-CM | POA: Diagnosis not present

## 2022-02-12 DIAGNOSIS — Z992 Dependence on renal dialysis: Secondary | ICD-10-CM | POA: Diagnosis not present

## 2022-02-12 DIAGNOSIS — N186 End stage renal disease: Secondary | ICD-10-CM | POA: Diagnosis not present

## 2022-02-14 DIAGNOSIS — N2581 Secondary hyperparathyroidism of renal origin: Secondary | ICD-10-CM | POA: Diagnosis not present

## 2022-02-14 DIAGNOSIS — Z992 Dependence on renal dialysis: Secondary | ICD-10-CM | POA: Diagnosis not present

## 2022-02-14 DIAGNOSIS — N186 End stage renal disease: Secondary | ICD-10-CM | POA: Diagnosis not present

## 2022-02-16 DIAGNOSIS — N186 End stage renal disease: Secondary | ICD-10-CM | POA: Diagnosis not present

## 2022-02-16 DIAGNOSIS — Z992 Dependence on renal dialysis: Secondary | ICD-10-CM | POA: Diagnosis not present

## 2022-02-16 DIAGNOSIS — N2581 Secondary hyperparathyroidism of renal origin: Secondary | ICD-10-CM | POA: Diagnosis not present

## 2022-02-19 DIAGNOSIS — Z992 Dependence on renal dialysis: Secondary | ICD-10-CM | POA: Diagnosis not present

## 2022-02-19 DIAGNOSIS — N186 End stage renal disease: Secondary | ICD-10-CM | POA: Diagnosis not present

## 2022-02-19 DIAGNOSIS — N2581 Secondary hyperparathyroidism of renal origin: Secondary | ICD-10-CM | POA: Diagnosis not present

## 2022-02-21 DIAGNOSIS — N2581 Secondary hyperparathyroidism of renal origin: Secondary | ICD-10-CM | POA: Diagnosis not present

## 2022-02-21 DIAGNOSIS — Z992 Dependence on renal dialysis: Secondary | ICD-10-CM | POA: Diagnosis not present

## 2022-02-21 DIAGNOSIS — N186 End stage renal disease: Secondary | ICD-10-CM | POA: Diagnosis not present

## 2022-02-23 DIAGNOSIS — N186 End stage renal disease: Secondary | ICD-10-CM | POA: Diagnosis not present

## 2022-02-23 DIAGNOSIS — N2581 Secondary hyperparathyroidism of renal origin: Secondary | ICD-10-CM | POA: Diagnosis not present

## 2022-02-23 DIAGNOSIS — Z992 Dependence on renal dialysis: Secondary | ICD-10-CM | POA: Diagnosis not present

## 2022-02-26 DIAGNOSIS — N2581 Secondary hyperparathyroidism of renal origin: Secondary | ICD-10-CM | POA: Diagnosis not present

## 2022-02-26 DIAGNOSIS — N186 End stage renal disease: Secondary | ICD-10-CM | POA: Diagnosis not present

## 2022-02-26 DIAGNOSIS — Z992 Dependence on renal dialysis: Secondary | ICD-10-CM | POA: Diagnosis not present

## 2022-02-28 DIAGNOSIS — N186 End stage renal disease: Secondary | ICD-10-CM | POA: Diagnosis not present

## 2022-02-28 DIAGNOSIS — Z992 Dependence on renal dialysis: Secondary | ICD-10-CM | POA: Diagnosis not present

## 2022-02-28 DIAGNOSIS — N2581 Secondary hyperparathyroidism of renal origin: Secondary | ICD-10-CM | POA: Diagnosis not present

## 2022-03-05 DIAGNOSIS — N2581 Secondary hyperparathyroidism of renal origin: Secondary | ICD-10-CM | POA: Diagnosis not present

## 2022-03-05 DIAGNOSIS — Z992 Dependence on renal dialysis: Secondary | ICD-10-CM | POA: Diagnosis not present

## 2022-03-05 DIAGNOSIS — N186 End stage renal disease: Secondary | ICD-10-CM | POA: Diagnosis not present

## 2022-03-07 DIAGNOSIS — N2581 Secondary hyperparathyroidism of renal origin: Secondary | ICD-10-CM | POA: Diagnosis not present

## 2022-03-07 DIAGNOSIS — Z992 Dependence on renal dialysis: Secondary | ICD-10-CM | POA: Diagnosis not present

## 2022-03-07 DIAGNOSIS — N186 End stage renal disease: Secondary | ICD-10-CM | POA: Diagnosis not present

## 2022-03-08 DIAGNOSIS — Z992 Dependence on renal dialysis: Secondary | ICD-10-CM | POA: Diagnosis not present

## 2022-03-08 DIAGNOSIS — N186 End stage renal disease: Secondary | ICD-10-CM | POA: Diagnosis not present

## 2022-03-09 DIAGNOSIS — N250 Renal osteodystrophy: Secondary | ICD-10-CM | POA: Diagnosis not present

## 2022-03-09 DIAGNOSIS — N186 End stage renal disease: Secondary | ICD-10-CM | POA: Diagnosis not present

## 2022-03-09 DIAGNOSIS — E559 Vitamin D deficiency, unspecified: Secondary | ICD-10-CM | POA: Diagnosis not present

## 2022-03-09 DIAGNOSIS — Z992 Dependence on renal dialysis: Secondary | ICD-10-CM | POA: Diagnosis not present

## 2022-03-09 DIAGNOSIS — N2581 Secondary hyperparathyroidism of renal origin: Secondary | ICD-10-CM | POA: Diagnosis not present

## 2022-03-12 DIAGNOSIS — N2581 Secondary hyperparathyroidism of renal origin: Secondary | ICD-10-CM | POA: Diagnosis not present

## 2022-03-12 DIAGNOSIS — N186 End stage renal disease: Secondary | ICD-10-CM | POA: Diagnosis not present

## 2022-03-12 DIAGNOSIS — E559 Vitamin D deficiency, unspecified: Secondary | ICD-10-CM | POA: Diagnosis not present

## 2022-03-12 DIAGNOSIS — Z992 Dependence on renal dialysis: Secondary | ICD-10-CM | POA: Diagnosis not present

## 2022-03-12 DIAGNOSIS — N250 Renal osteodystrophy: Secondary | ICD-10-CM | POA: Diagnosis not present

## 2022-03-14 DIAGNOSIS — N186 End stage renal disease: Secondary | ICD-10-CM | POA: Diagnosis not present

## 2022-03-14 DIAGNOSIS — N2581 Secondary hyperparathyroidism of renal origin: Secondary | ICD-10-CM | POA: Diagnosis not present

## 2022-03-14 DIAGNOSIS — E559 Vitamin D deficiency, unspecified: Secondary | ICD-10-CM | POA: Diagnosis not present

## 2022-03-14 DIAGNOSIS — N250 Renal osteodystrophy: Secondary | ICD-10-CM | POA: Diagnosis not present

## 2022-03-14 DIAGNOSIS — Z992 Dependence on renal dialysis: Secondary | ICD-10-CM | POA: Diagnosis not present

## 2022-03-16 DIAGNOSIS — Z992 Dependence on renal dialysis: Secondary | ICD-10-CM | POA: Diagnosis not present

## 2022-03-16 DIAGNOSIS — N186 End stage renal disease: Secondary | ICD-10-CM | POA: Diagnosis not present

## 2022-03-16 DIAGNOSIS — E559 Vitamin D deficiency, unspecified: Secondary | ICD-10-CM | POA: Diagnosis not present

## 2022-03-16 DIAGNOSIS — N250 Renal osteodystrophy: Secondary | ICD-10-CM | POA: Diagnosis not present

## 2022-03-16 DIAGNOSIS — N2581 Secondary hyperparathyroidism of renal origin: Secondary | ICD-10-CM | POA: Diagnosis not present

## 2022-03-19 DIAGNOSIS — N186 End stage renal disease: Secondary | ICD-10-CM | POA: Diagnosis not present

## 2022-03-19 DIAGNOSIS — E559 Vitamin D deficiency, unspecified: Secondary | ICD-10-CM | POA: Diagnosis not present

## 2022-03-19 DIAGNOSIS — N250 Renal osteodystrophy: Secondary | ICD-10-CM | POA: Diagnosis not present

## 2022-03-19 DIAGNOSIS — Z992 Dependence on renal dialysis: Secondary | ICD-10-CM | POA: Diagnosis not present

## 2022-03-19 DIAGNOSIS — N2581 Secondary hyperparathyroidism of renal origin: Secondary | ICD-10-CM | POA: Diagnosis not present

## 2022-03-22 DIAGNOSIS — N2581 Secondary hyperparathyroidism of renal origin: Secondary | ICD-10-CM | POA: Diagnosis not present

## 2022-03-22 DIAGNOSIS — E559 Vitamin D deficiency, unspecified: Secondary | ICD-10-CM | POA: Diagnosis not present

## 2022-03-22 DIAGNOSIS — Z992 Dependence on renal dialysis: Secondary | ICD-10-CM | POA: Diagnosis not present

## 2022-03-22 DIAGNOSIS — N250 Renal osteodystrophy: Secondary | ICD-10-CM | POA: Diagnosis not present

## 2022-03-22 DIAGNOSIS — N186 End stage renal disease: Secondary | ICD-10-CM | POA: Diagnosis not present

## 2022-03-23 DIAGNOSIS — N250 Renal osteodystrophy: Secondary | ICD-10-CM | POA: Diagnosis not present

## 2022-03-23 DIAGNOSIS — Z992 Dependence on renal dialysis: Secondary | ICD-10-CM | POA: Diagnosis not present

## 2022-03-23 DIAGNOSIS — N2581 Secondary hyperparathyroidism of renal origin: Secondary | ICD-10-CM | POA: Diagnosis not present

## 2022-03-23 DIAGNOSIS — E559 Vitamin D deficiency, unspecified: Secondary | ICD-10-CM | POA: Diagnosis not present

## 2022-03-23 DIAGNOSIS — N186 End stage renal disease: Secondary | ICD-10-CM | POA: Diagnosis not present

## 2022-03-26 DIAGNOSIS — N186 End stage renal disease: Secondary | ICD-10-CM | POA: Diagnosis not present

## 2022-03-26 DIAGNOSIS — N2581 Secondary hyperparathyroidism of renal origin: Secondary | ICD-10-CM | POA: Diagnosis not present

## 2022-03-26 DIAGNOSIS — N250 Renal osteodystrophy: Secondary | ICD-10-CM | POA: Diagnosis not present

## 2022-03-26 DIAGNOSIS — Z992 Dependence on renal dialysis: Secondary | ICD-10-CM | POA: Diagnosis not present

## 2022-03-26 DIAGNOSIS — E559 Vitamin D deficiency, unspecified: Secondary | ICD-10-CM | POA: Diagnosis not present

## 2022-03-28 DIAGNOSIS — N250 Renal osteodystrophy: Secondary | ICD-10-CM | POA: Diagnosis not present

## 2022-03-28 DIAGNOSIS — N186 End stage renal disease: Secondary | ICD-10-CM | POA: Diagnosis not present

## 2022-03-28 DIAGNOSIS — N2581 Secondary hyperparathyroidism of renal origin: Secondary | ICD-10-CM | POA: Diagnosis not present

## 2022-03-28 DIAGNOSIS — Z992 Dependence on renal dialysis: Secondary | ICD-10-CM | POA: Diagnosis not present

## 2022-03-28 DIAGNOSIS — E559 Vitamin D deficiency, unspecified: Secondary | ICD-10-CM | POA: Diagnosis not present

## 2022-03-30 DIAGNOSIS — N250 Renal osteodystrophy: Secondary | ICD-10-CM | POA: Diagnosis not present

## 2022-03-30 DIAGNOSIS — N186 End stage renal disease: Secondary | ICD-10-CM | POA: Diagnosis not present

## 2022-03-30 DIAGNOSIS — E559 Vitamin D deficiency, unspecified: Secondary | ICD-10-CM | POA: Diagnosis not present

## 2022-03-30 DIAGNOSIS — N2581 Secondary hyperparathyroidism of renal origin: Secondary | ICD-10-CM | POA: Diagnosis not present

## 2022-03-30 DIAGNOSIS — Z992 Dependence on renal dialysis: Secondary | ICD-10-CM | POA: Diagnosis not present

## 2022-04-02 DIAGNOSIS — N250 Renal osteodystrophy: Secondary | ICD-10-CM | POA: Diagnosis not present

## 2022-04-02 DIAGNOSIS — E559 Vitamin D deficiency, unspecified: Secondary | ICD-10-CM | POA: Diagnosis not present

## 2022-04-02 DIAGNOSIS — Z992 Dependence on renal dialysis: Secondary | ICD-10-CM | POA: Diagnosis not present

## 2022-04-02 DIAGNOSIS — N186 End stage renal disease: Secondary | ICD-10-CM | POA: Diagnosis not present

## 2022-04-02 DIAGNOSIS — N2581 Secondary hyperparathyroidism of renal origin: Secondary | ICD-10-CM | POA: Diagnosis not present

## 2022-04-04 DIAGNOSIS — N186 End stage renal disease: Secondary | ICD-10-CM | POA: Diagnosis not present

## 2022-04-04 DIAGNOSIS — N2581 Secondary hyperparathyroidism of renal origin: Secondary | ICD-10-CM | POA: Diagnosis not present

## 2022-04-04 DIAGNOSIS — E559 Vitamin D deficiency, unspecified: Secondary | ICD-10-CM | POA: Diagnosis not present

## 2022-04-04 DIAGNOSIS — N250 Renal osteodystrophy: Secondary | ICD-10-CM | POA: Diagnosis not present

## 2022-04-04 DIAGNOSIS — Z992 Dependence on renal dialysis: Secondary | ICD-10-CM | POA: Diagnosis not present

## 2022-04-06 DIAGNOSIS — N250 Renal osteodystrophy: Secondary | ICD-10-CM | POA: Diagnosis not present

## 2022-04-06 DIAGNOSIS — N2581 Secondary hyperparathyroidism of renal origin: Secondary | ICD-10-CM | POA: Diagnosis not present

## 2022-04-06 DIAGNOSIS — E559 Vitamin D deficiency, unspecified: Secondary | ICD-10-CM | POA: Diagnosis not present

## 2022-04-06 DIAGNOSIS — N186 End stage renal disease: Secondary | ICD-10-CM | POA: Diagnosis not present

## 2022-04-06 DIAGNOSIS — Z992 Dependence on renal dialysis: Secondary | ICD-10-CM | POA: Diagnosis not present

## 2022-04-07 DIAGNOSIS — Z992 Dependence on renal dialysis: Secondary | ICD-10-CM | POA: Diagnosis not present

## 2022-04-07 DIAGNOSIS — N186 End stage renal disease: Secondary | ICD-10-CM | POA: Diagnosis not present

## 2022-04-09 DIAGNOSIS — N250 Renal osteodystrophy: Secondary | ICD-10-CM | POA: Diagnosis not present

## 2022-04-09 DIAGNOSIS — N2581 Secondary hyperparathyroidism of renal origin: Secondary | ICD-10-CM | POA: Diagnosis not present

## 2022-04-09 DIAGNOSIS — N186 End stage renal disease: Secondary | ICD-10-CM | POA: Diagnosis not present

## 2022-04-09 DIAGNOSIS — E559 Vitamin D deficiency, unspecified: Secondary | ICD-10-CM | POA: Diagnosis not present

## 2022-04-09 DIAGNOSIS — Z992 Dependence on renal dialysis: Secondary | ICD-10-CM | POA: Diagnosis not present

## 2022-04-11 DIAGNOSIS — N186 End stage renal disease: Secondary | ICD-10-CM | POA: Diagnosis not present

## 2022-04-11 DIAGNOSIS — E559 Vitamin D deficiency, unspecified: Secondary | ICD-10-CM | POA: Diagnosis not present

## 2022-04-11 DIAGNOSIS — N2581 Secondary hyperparathyroidism of renal origin: Secondary | ICD-10-CM | POA: Diagnosis not present

## 2022-04-11 DIAGNOSIS — N250 Renal osteodystrophy: Secondary | ICD-10-CM | POA: Diagnosis not present

## 2022-04-11 DIAGNOSIS — Z992 Dependence on renal dialysis: Secondary | ICD-10-CM | POA: Diagnosis not present

## 2022-04-13 DIAGNOSIS — Z992 Dependence on renal dialysis: Secondary | ICD-10-CM | POA: Diagnosis not present

## 2022-04-13 DIAGNOSIS — N2581 Secondary hyperparathyroidism of renal origin: Secondary | ICD-10-CM | POA: Diagnosis not present

## 2022-04-13 DIAGNOSIS — N186 End stage renal disease: Secondary | ICD-10-CM | POA: Diagnosis not present

## 2022-04-13 DIAGNOSIS — N250 Renal osteodystrophy: Secondary | ICD-10-CM | POA: Diagnosis not present

## 2022-04-13 DIAGNOSIS — E559 Vitamin D deficiency, unspecified: Secondary | ICD-10-CM | POA: Diagnosis not present

## 2022-04-16 DIAGNOSIS — Z992 Dependence on renal dialysis: Secondary | ICD-10-CM | POA: Diagnosis not present

## 2022-04-16 DIAGNOSIS — N186 End stage renal disease: Secondary | ICD-10-CM | POA: Diagnosis not present

## 2022-04-16 DIAGNOSIS — E559 Vitamin D deficiency, unspecified: Secondary | ICD-10-CM | POA: Diagnosis not present

## 2022-04-16 DIAGNOSIS — N2581 Secondary hyperparathyroidism of renal origin: Secondary | ICD-10-CM | POA: Diagnosis not present

## 2022-04-16 DIAGNOSIS — N250 Renal osteodystrophy: Secondary | ICD-10-CM | POA: Diagnosis not present

## 2022-04-18 DIAGNOSIS — N2581 Secondary hyperparathyroidism of renal origin: Secondary | ICD-10-CM | POA: Diagnosis not present

## 2022-04-18 DIAGNOSIS — E559 Vitamin D deficiency, unspecified: Secondary | ICD-10-CM | POA: Diagnosis not present

## 2022-04-18 DIAGNOSIS — N186 End stage renal disease: Secondary | ICD-10-CM | POA: Diagnosis not present

## 2022-04-18 DIAGNOSIS — N250 Renal osteodystrophy: Secondary | ICD-10-CM | POA: Diagnosis not present

## 2022-04-18 DIAGNOSIS — Z992 Dependence on renal dialysis: Secondary | ICD-10-CM | POA: Diagnosis not present

## 2022-04-19 DIAGNOSIS — M546 Pain in thoracic spine: Secondary | ICD-10-CM | POA: Diagnosis not present

## 2022-04-19 DIAGNOSIS — M542 Cervicalgia: Secondary | ICD-10-CM | POA: Diagnosis not present

## 2022-04-19 DIAGNOSIS — M9903 Segmental and somatic dysfunction of lumbar region: Secondary | ICD-10-CM | POA: Diagnosis not present

## 2022-04-19 DIAGNOSIS — M9901 Segmental and somatic dysfunction of cervical region: Secondary | ICD-10-CM | POA: Diagnosis not present

## 2022-04-19 DIAGNOSIS — M9902 Segmental and somatic dysfunction of thoracic region: Secondary | ICD-10-CM | POA: Diagnosis not present

## 2022-04-20 DIAGNOSIS — Z992 Dependence on renal dialysis: Secondary | ICD-10-CM | POA: Diagnosis not present

## 2022-04-20 DIAGNOSIS — N186 End stage renal disease: Secondary | ICD-10-CM | POA: Diagnosis not present

## 2022-04-20 DIAGNOSIS — N250 Renal osteodystrophy: Secondary | ICD-10-CM | POA: Diagnosis not present

## 2022-04-20 DIAGNOSIS — N2581 Secondary hyperparathyroidism of renal origin: Secondary | ICD-10-CM | POA: Diagnosis not present

## 2022-04-20 DIAGNOSIS — E559 Vitamin D deficiency, unspecified: Secondary | ICD-10-CM | POA: Diagnosis not present

## 2022-04-23 DIAGNOSIS — Z992 Dependence on renal dialysis: Secondary | ICD-10-CM | POA: Diagnosis not present

## 2022-04-23 DIAGNOSIS — N250 Renal osteodystrophy: Secondary | ICD-10-CM | POA: Diagnosis not present

## 2022-04-23 DIAGNOSIS — E559 Vitamin D deficiency, unspecified: Secondary | ICD-10-CM | POA: Diagnosis not present

## 2022-04-23 DIAGNOSIS — N186 End stage renal disease: Secondary | ICD-10-CM | POA: Diagnosis not present

## 2022-04-23 DIAGNOSIS — N2581 Secondary hyperparathyroidism of renal origin: Secondary | ICD-10-CM | POA: Diagnosis not present

## 2022-04-25 DIAGNOSIS — N2581 Secondary hyperparathyroidism of renal origin: Secondary | ICD-10-CM | POA: Diagnosis not present

## 2022-04-25 DIAGNOSIS — Z992 Dependence on renal dialysis: Secondary | ICD-10-CM | POA: Diagnosis not present

## 2022-04-25 DIAGNOSIS — N186 End stage renal disease: Secondary | ICD-10-CM | POA: Diagnosis not present

## 2022-04-25 DIAGNOSIS — E559 Vitamin D deficiency, unspecified: Secondary | ICD-10-CM | POA: Diagnosis not present

## 2022-04-25 DIAGNOSIS — N250 Renal osteodystrophy: Secondary | ICD-10-CM | POA: Diagnosis not present

## 2022-04-27 DIAGNOSIS — N2581 Secondary hyperparathyroidism of renal origin: Secondary | ICD-10-CM | POA: Diagnosis not present

## 2022-04-27 DIAGNOSIS — E559 Vitamin D deficiency, unspecified: Secondary | ICD-10-CM | POA: Diagnosis not present

## 2022-04-27 DIAGNOSIS — N186 End stage renal disease: Secondary | ICD-10-CM | POA: Diagnosis not present

## 2022-04-27 DIAGNOSIS — Z992 Dependence on renal dialysis: Secondary | ICD-10-CM | POA: Diagnosis not present

## 2022-04-27 DIAGNOSIS — N250 Renal osteodystrophy: Secondary | ICD-10-CM | POA: Diagnosis not present

## 2022-04-30 DIAGNOSIS — E559 Vitamin D deficiency, unspecified: Secondary | ICD-10-CM | POA: Diagnosis not present

## 2022-04-30 DIAGNOSIS — N2581 Secondary hyperparathyroidism of renal origin: Secondary | ICD-10-CM | POA: Diagnosis not present

## 2022-04-30 DIAGNOSIS — N250 Renal osteodystrophy: Secondary | ICD-10-CM | POA: Diagnosis not present

## 2022-04-30 DIAGNOSIS — Z992 Dependence on renal dialysis: Secondary | ICD-10-CM | POA: Diagnosis not present

## 2022-04-30 DIAGNOSIS — N186 End stage renal disease: Secondary | ICD-10-CM | POA: Diagnosis not present

## 2022-05-07 DIAGNOSIS — N250 Renal osteodystrophy: Secondary | ICD-10-CM | POA: Diagnosis not present

## 2022-05-07 DIAGNOSIS — Z992 Dependence on renal dialysis: Secondary | ICD-10-CM | POA: Diagnosis not present

## 2022-05-07 DIAGNOSIS — N2581 Secondary hyperparathyroidism of renal origin: Secondary | ICD-10-CM | POA: Diagnosis not present

## 2022-05-07 DIAGNOSIS — N186 End stage renal disease: Secondary | ICD-10-CM | POA: Diagnosis not present

## 2022-05-07 DIAGNOSIS — E559 Vitamin D deficiency, unspecified: Secondary | ICD-10-CM | POA: Diagnosis not present

## 2022-05-09 DIAGNOSIS — Z992 Dependence on renal dialysis: Secondary | ICD-10-CM | POA: Diagnosis not present

## 2022-05-09 DIAGNOSIS — E559 Vitamin D deficiency, unspecified: Secondary | ICD-10-CM | POA: Diagnosis not present

## 2022-05-09 DIAGNOSIS — N2581 Secondary hyperparathyroidism of renal origin: Secondary | ICD-10-CM | POA: Diagnosis not present

## 2022-05-09 DIAGNOSIS — N186 End stage renal disease: Secondary | ICD-10-CM | POA: Diagnosis not present

## 2022-05-09 DIAGNOSIS — N250 Renal osteodystrophy: Secondary | ICD-10-CM | POA: Diagnosis not present

## 2022-05-14 DIAGNOSIS — E559 Vitamin D deficiency, unspecified: Secondary | ICD-10-CM | POA: Diagnosis not present

## 2022-05-14 DIAGNOSIS — N250 Renal osteodystrophy: Secondary | ICD-10-CM | POA: Diagnosis not present

## 2022-05-14 DIAGNOSIS — Z992 Dependence on renal dialysis: Secondary | ICD-10-CM | POA: Diagnosis not present

## 2022-05-14 DIAGNOSIS — N2581 Secondary hyperparathyroidism of renal origin: Secondary | ICD-10-CM | POA: Diagnosis not present

## 2022-05-14 DIAGNOSIS — N186 End stage renal disease: Secondary | ICD-10-CM | POA: Diagnosis not present

## 2022-05-16 DIAGNOSIS — E559 Vitamin D deficiency, unspecified: Secondary | ICD-10-CM | POA: Diagnosis not present

## 2022-05-16 DIAGNOSIS — N2581 Secondary hyperparathyroidism of renal origin: Secondary | ICD-10-CM | POA: Diagnosis not present

## 2022-05-16 DIAGNOSIS — N186 End stage renal disease: Secondary | ICD-10-CM | POA: Diagnosis not present

## 2022-05-16 DIAGNOSIS — Z992 Dependence on renal dialysis: Secondary | ICD-10-CM | POA: Diagnosis not present

## 2022-05-16 DIAGNOSIS — N250 Renal osteodystrophy: Secondary | ICD-10-CM | POA: Diagnosis not present

## 2022-05-18 DIAGNOSIS — N250 Renal osteodystrophy: Secondary | ICD-10-CM | POA: Diagnosis not present

## 2022-05-18 DIAGNOSIS — E559 Vitamin D deficiency, unspecified: Secondary | ICD-10-CM | POA: Diagnosis not present

## 2022-05-18 DIAGNOSIS — Z992 Dependence on renal dialysis: Secondary | ICD-10-CM | POA: Diagnosis not present

## 2022-05-18 DIAGNOSIS — N2581 Secondary hyperparathyroidism of renal origin: Secondary | ICD-10-CM | POA: Diagnosis not present

## 2022-05-18 DIAGNOSIS — N186 End stage renal disease: Secondary | ICD-10-CM | POA: Diagnosis not present

## 2022-05-21 DIAGNOSIS — E559 Vitamin D deficiency, unspecified: Secondary | ICD-10-CM | POA: Diagnosis not present

## 2022-05-21 DIAGNOSIS — Z992 Dependence on renal dialysis: Secondary | ICD-10-CM | POA: Diagnosis not present

## 2022-05-21 DIAGNOSIS — N250 Renal osteodystrophy: Secondary | ICD-10-CM | POA: Diagnosis not present

## 2022-05-21 DIAGNOSIS — N186 End stage renal disease: Secondary | ICD-10-CM | POA: Diagnosis not present

## 2022-05-21 DIAGNOSIS — N2581 Secondary hyperparathyroidism of renal origin: Secondary | ICD-10-CM | POA: Diagnosis not present

## 2022-05-23 DIAGNOSIS — E559 Vitamin D deficiency, unspecified: Secondary | ICD-10-CM | POA: Diagnosis not present

## 2022-05-23 DIAGNOSIS — N186 End stage renal disease: Secondary | ICD-10-CM | POA: Diagnosis not present

## 2022-05-23 DIAGNOSIS — Z992 Dependence on renal dialysis: Secondary | ICD-10-CM | POA: Diagnosis not present

## 2022-05-23 DIAGNOSIS — N2581 Secondary hyperparathyroidism of renal origin: Secondary | ICD-10-CM | POA: Diagnosis not present

## 2022-05-23 DIAGNOSIS — N250 Renal osteodystrophy: Secondary | ICD-10-CM | POA: Diagnosis not present

## 2022-05-25 DIAGNOSIS — N250 Renal osteodystrophy: Secondary | ICD-10-CM | POA: Diagnosis not present

## 2022-05-25 DIAGNOSIS — Z992 Dependence on renal dialysis: Secondary | ICD-10-CM | POA: Diagnosis not present

## 2022-05-25 DIAGNOSIS — N2581 Secondary hyperparathyroidism of renal origin: Secondary | ICD-10-CM | POA: Diagnosis not present

## 2022-05-25 DIAGNOSIS — N186 End stage renal disease: Secondary | ICD-10-CM | POA: Diagnosis not present

## 2022-05-25 DIAGNOSIS — E559 Vitamin D deficiency, unspecified: Secondary | ICD-10-CM | POA: Diagnosis not present

## 2022-05-28 DIAGNOSIS — E559 Vitamin D deficiency, unspecified: Secondary | ICD-10-CM | POA: Diagnosis not present

## 2022-05-28 DIAGNOSIS — N2581 Secondary hyperparathyroidism of renal origin: Secondary | ICD-10-CM | POA: Diagnosis not present

## 2022-05-28 DIAGNOSIS — Z992 Dependence on renal dialysis: Secondary | ICD-10-CM | POA: Diagnosis not present

## 2022-05-28 DIAGNOSIS — N250 Renal osteodystrophy: Secondary | ICD-10-CM | POA: Diagnosis not present

## 2022-05-28 DIAGNOSIS — N186 End stage renal disease: Secondary | ICD-10-CM | POA: Diagnosis not present

## 2022-05-30 DIAGNOSIS — N186 End stage renal disease: Secondary | ICD-10-CM | POA: Diagnosis not present

## 2022-05-30 DIAGNOSIS — E559 Vitamin D deficiency, unspecified: Secondary | ICD-10-CM | POA: Diagnosis not present

## 2022-05-30 DIAGNOSIS — N250 Renal osteodystrophy: Secondary | ICD-10-CM | POA: Diagnosis not present

## 2022-05-30 DIAGNOSIS — Z992 Dependence on renal dialysis: Secondary | ICD-10-CM | POA: Diagnosis not present

## 2022-05-30 DIAGNOSIS — N2581 Secondary hyperparathyroidism of renal origin: Secondary | ICD-10-CM | POA: Diagnosis not present

## 2022-06-01 DIAGNOSIS — N2581 Secondary hyperparathyroidism of renal origin: Secondary | ICD-10-CM | POA: Diagnosis not present

## 2022-06-01 DIAGNOSIS — E559 Vitamin D deficiency, unspecified: Secondary | ICD-10-CM | POA: Diagnosis not present

## 2022-06-01 DIAGNOSIS — N250 Renal osteodystrophy: Secondary | ICD-10-CM | POA: Diagnosis not present

## 2022-06-01 DIAGNOSIS — N186 End stage renal disease: Secondary | ICD-10-CM | POA: Diagnosis not present

## 2022-06-01 DIAGNOSIS — Z992 Dependence on renal dialysis: Secondary | ICD-10-CM | POA: Diagnosis not present

## 2022-06-04 DIAGNOSIS — N250 Renal osteodystrophy: Secondary | ICD-10-CM | POA: Diagnosis not present

## 2022-06-04 DIAGNOSIS — E559 Vitamin D deficiency, unspecified: Secondary | ICD-10-CM | POA: Diagnosis not present

## 2022-06-04 DIAGNOSIS — N186 End stage renal disease: Secondary | ICD-10-CM | POA: Diagnosis not present

## 2022-06-04 DIAGNOSIS — Z992 Dependence on renal dialysis: Secondary | ICD-10-CM | POA: Diagnosis not present

## 2022-06-04 DIAGNOSIS — N2581 Secondary hyperparathyroidism of renal origin: Secondary | ICD-10-CM | POA: Diagnosis not present

## 2022-06-06 DIAGNOSIS — N250 Renal osteodystrophy: Secondary | ICD-10-CM | POA: Diagnosis not present

## 2022-06-06 DIAGNOSIS — Z992 Dependence on renal dialysis: Secondary | ICD-10-CM | POA: Diagnosis not present

## 2022-06-06 DIAGNOSIS — N186 End stage renal disease: Secondary | ICD-10-CM | POA: Diagnosis not present

## 2022-06-06 DIAGNOSIS — E559 Vitamin D deficiency, unspecified: Secondary | ICD-10-CM | POA: Diagnosis not present

## 2022-06-06 DIAGNOSIS — N2581 Secondary hyperparathyroidism of renal origin: Secondary | ICD-10-CM | POA: Diagnosis not present

## 2022-06-07 DIAGNOSIS — N186 End stage renal disease: Secondary | ICD-10-CM | POA: Diagnosis not present

## 2022-06-07 DIAGNOSIS — Z992 Dependence on renal dialysis: Secondary | ICD-10-CM | POA: Diagnosis not present

## 2022-06-08 DIAGNOSIS — E559 Vitamin D deficiency, unspecified: Secondary | ICD-10-CM | POA: Diagnosis not present

## 2022-06-08 DIAGNOSIS — Z992 Dependence on renal dialysis: Secondary | ICD-10-CM | POA: Diagnosis not present

## 2022-06-08 DIAGNOSIS — N250 Renal osteodystrophy: Secondary | ICD-10-CM | POA: Diagnosis not present

## 2022-06-08 DIAGNOSIS — N186 End stage renal disease: Secondary | ICD-10-CM | POA: Diagnosis not present

## 2022-06-08 DIAGNOSIS — N2581 Secondary hyperparathyroidism of renal origin: Secondary | ICD-10-CM | POA: Diagnosis not present

## 2022-06-11 DIAGNOSIS — N250 Renal osteodystrophy: Secondary | ICD-10-CM | POA: Diagnosis not present

## 2022-06-11 DIAGNOSIS — Z992 Dependence on renal dialysis: Secondary | ICD-10-CM | POA: Diagnosis not present

## 2022-06-11 DIAGNOSIS — N2581 Secondary hyperparathyroidism of renal origin: Secondary | ICD-10-CM | POA: Diagnosis not present

## 2022-06-11 DIAGNOSIS — E559 Vitamin D deficiency, unspecified: Secondary | ICD-10-CM | POA: Diagnosis not present

## 2022-06-11 DIAGNOSIS — N186 End stage renal disease: Secondary | ICD-10-CM | POA: Diagnosis not present

## 2022-06-13 DIAGNOSIS — Z992 Dependence on renal dialysis: Secondary | ICD-10-CM | POA: Diagnosis not present

## 2022-06-13 DIAGNOSIS — N2581 Secondary hyperparathyroidism of renal origin: Secondary | ICD-10-CM | POA: Diagnosis not present

## 2022-06-13 DIAGNOSIS — N186 End stage renal disease: Secondary | ICD-10-CM | POA: Diagnosis not present

## 2022-06-13 DIAGNOSIS — N250 Renal osteodystrophy: Secondary | ICD-10-CM | POA: Diagnosis not present

## 2022-06-13 DIAGNOSIS — E559 Vitamin D deficiency, unspecified: Secondary | ICD-10-CM | POA: Diagnosis not present

## 2022-06-15 DIAGNOSIS — E559 Vitamin D deficiency, unspecified: Secondary | ICD-10-CM | POA: Diagnosis not present

## 2022-06-15 DIAGNOSIS — N250 Renal osteodystrophy: Secondary | ICD-10-CM | POA: Diagnosis not present

## 2022-06-15 DIAGNOSIS — Z992 Dependence on renal dialysis: Secondary | ICD-10-CM | POA: Diagnosis not present

## 2022-06-15 DIAGNOSIS — N2581 Secondary hyperparathyroidism of renal origin: Secondary | ICD-10-CM | POA: Diagnosis not present

## 2022-06-15 DIAGNOSIS — N186 End stage renal disease: Secondary | ICD-10-CM | POA: Diagnosis not present

## 2022-06-18 DIAGNOSIS — E559 Vitamin D deficiency, unspecified: Secondary | ICD-10-CM | POA: Diagnosis not present

## 2022-06-18 DIAGNOSIS — N186 End stage renal disease: Secondary | ICD-10-CM | POA: Diagnosis not present

## 2022-06-18 DIAGNOSIS — Z992 Dependence on renal dialysis: Secondary | ICD-10-CM | POA: Diagnosis not present

## 2022-06-18 DIAGNOSIS — N250 Renal osteodystrophy: Secondary | ICD-10-CM | POA: Diagnosis not present

## 2022-06-18 DIAGNOSIS — N2581 Secondary hyperparathyroidism of renal origin: Secondary | ICD-10-CM | POA: Diagnosis not present

## 2022-06-20 DIAGNOSIS — N2581 Secondary hyperparathyroidism of renal origin: Secondary | ICD-10-CM | POA: Diagnosis not present

## 2022-06-20 DIAGNOSIS — N250 Renal osteodystrophy: Secondary | ICD-10-CM | POA: Diagnosis not present

## 2022-06-20 DIAGNOSIS — E559 Vitamin D deficiency, unspecified: Secondary | ICD-10-CM | POA: Diagnosis not present

## 2022-06-20 DIAGNOSIS — Z992 Dependence on renal dialysis: Secondary | ICD-10-CM | POA: Diagnosis not present

## 2022-06-20 DIAGNOSIS — N186 End stage renal disease: Secondary | ICD-10-CM | POA: Diagnosis not present

## 2022-06-22 DIAGNOSIS — Z992 Dependence on renal dialysis: Secondary | ICD-10-CM | POA: Diagnosis not present

## 2022-06-22 DIAGNOSIS — E559 Vitamin D deficiency, unspecified: Secondary | ICD-10-CM | POA: Diagnosis not present

## 2022-06-22 DIAGNOSIS — N186 End stage renal disease: Secondary | ICD-10-CM | POA: Diagnosis not present

## 2022-06-22 DIAGNOSIS — N2581 Secondary hyperparathyroidism of renal origin: Secondary | ICD-10-CM | POA: Diagnosis not present

## 2022-06-22 DIAGNOSIS — N250 Renal osteodystrophy: Secondary | ICD-10-CM | POA: Diagnosis not present

## 2022-06-25 DIAGNOSIS — N2581 Secondary hyperparathyroidism of renal origin: Secondary | ICD-10-CM | POA: Diagnosis not present

## 2022-06-25 DIAGNOSIS — N250 Renal osteodystrophy: Secondary | ICD-10-CM | POA: Diagnosis not present

## 2022-06-25 DIAGNOSIS — Z992 Dependence on renal dialysis: Secondary | ICD-10-CM | POA: Diagnosis not present

## 2022-06-25 DIAGNOSIS — E559 Vitamin D deficiency, unspecified: Secondary | ICD-10-CM | POA: Diagnosis not present

## 2022-06-25 DIAGNOSIS — N186 End stage renal disease: Secondary | ICD-10-CM | POA: Diagnosis not present

## 2022-06-27 DIAGNOSIS — Z992 Dependence on renal dialysis: Secondary | ICD-10-CM | POA: Diagnosis not present

## 2022-06-27 DIAGNOSIS — N186 End stage renal disease: Secondary | ICD-10-CM | POA: Diagnosis not present

## 2022-06-27 DIAGNOSIS — N250 Renal osteodystrophy: Secondary | ICD-10-CM | POA: Diagnosis not present

## 2022-06-27 DIAGNOSIS — N2581 Secondary hyperparathyroidism of renal origin: Secondary | ICD-10-CM | POA: Diagnosis not present

## 2022-06-27 DIAGNOSIS — E559 Vitamin D deficiency, unspecified: Secondary | ICD-10-CM | POA: Diagnosis not present

## 2022-06-29 DIAGNOSIS — N250 Renal osteodystrophy: Secondary | ICD-10-CM | POA: Diagnosis not present

## 2022-06-29 DIAGNOSIS — N186 End stage renal disease: Secondary | ICD-10-CM | POA: Diagnosis not present

## 2022-06-29 DIAGNOSIS — N2581 Secondary hyperparathyroidism of renal origin: Secondary | ICD-10-CM | POA: Diagnosis not present

## 2022-06-29 DIAGNOSIS — Z992 Dependence on renal dialysis: Secondary | ICD-10-CM | POA: Diagnosis not present

## 2022-06-29 DIAGNOSIS — E559 Vitamin D deficiency, unspecified: Secondary | ICD-10-CM | POA: Diagnosis not present

## 2022-07-02 DIAGNOSIS — E559 Vitamin D deficiency, unspecified: Secondary | ICD-10-CM | POA: Diagnosis not present

## 2022-07-02 DIAGNOSIS — Z992 Dependence on renal dialysis: Secondary | ICD-10-CM | POA: Diagnosis not present

## 2022-07-02 DIAGNOSIS — N186 End stage renal disease: Secondary | ICD-10-CM | POA: Diagnosis not present

## 2022-07-02 DIAGNOSIS — N2581 Secondary hyperparathyroidism of renal origin: Secondary | ICD-10-CM | POA: Diagnosis not present

## 2022-07-02 DIAGNOSIS — N250 Renal osteodystrophy: Secondary | ICD-10-CM | POA: Diagnosis not present

## 2022-07-04 DIAGNOSIS — N186 End stage renal disease: Secondary | ICD-10-CM | POA: Diagnosis not present

## 2022-07-04 DIAGNOSIS — N250 Renal osteodystrophy: Secondary | ICD-10-CM | POA: Diagnosis not present

## 2022-07-04 DIAGNOSIS — N2581 Secondary hyperparathyroidism of renal origin: Secondary | ICD-10-CM | POA: Diagnosis not present

## 2022-07-04 DIAGNOSIS — Z992 Dependence on renal dialysis: Secondary | ICD-10-CM | POA: Diagnosis not present

## 2022-07-04 DIAGNOSIS — E559 Vitamin D deficiency, unspecified: Secondary | ICD-10-CM | POA: Diagnosis not present

## 2022-07-06 DIAGNOSIS — N250 Renal osteodystrophy: Secondary | ICD-10-CM | POA: Diagnosis not present

## 2022-07-06 DIAGNOSIS — N186 End stage renal disease: Secondary | ICD-10-CM | POA: Diagnosis not present

## 2022-07-06 DIAGNOSIS — Z992 Dependence on renal dialysis: Secondary | ICD-10-CM | POA: Diagnosis not present

## 2022-07-06 DIAGNOSIS — N2581 Secondary hyperparathyroidism of renal origin: Secondary | ICD-10-CM | POA: Diagnosis not present

## 2022-07-06 DIAGNOSIS — E559 Vitamin D deficiency, unspecified: Secondary | ICD-10-CM | POA: Diagnosis not present

## 2022-07-07 NOTE — Progress Notes (Unsigned)
Cardiology Office Note:    Date:  07/09/2022   ID:  Isaiah Ramos, DOB 04/13/1976, MRN [id number]  PCP:  Neale Burly, MD  Cardiologist:  None  Electrophysiologist:  None   Referring MD: Neale Burly, MD   Chief Complaint  Patient presents with   Chest Pain    History of Present Illness:    Isaiah Ramos is a 46 y.o. male with a hx of ESRD, hypertension, hyperlipidemia who presents for follow-up.  He was referred by Dr. Sherrie Sport for preop evaluation prior to kidney transplant, initially seen 10/26/2021.  Needs stress test prior to transplant.  Echocardiogram 09/21/2021 showed EF 50 to 55%, severe LVH, grade 3 diastolic dysfunction, normal RV function, severe left atrial dilatation, mild AI.  Lexiscan Myoview on 11/16/2021 showed predominantly fixed inferior perfusion defect with normal wall motion, suspected diaphragmatic attenuation artifact.  EF 45% on stress test, suspected artifact given normal on echo.  Since last clinic visit, he reports he has been doing okay.  He continues to smoke 3 cigarettes/day, needs to quit to be able to be listed for renal transplant.  Reports occasional chest pain/dyspnea on exertion, no changes from last year.  He is walking 2 to 3 days/week for 10 minutes.  Reports occasional lightheadedness, denies any syncope.  No lower extremity edema.  Reports having palpitations about once every 2 weeks, last for about 5 minutes and feels like heart is racing.   Past Medical History:  Diagnosis Date   Anxiety    Arthritis    Gout   Cardiomegaly    Depression    Dialysis patient ([place])    Dyspnea on exertion    Fracture, foot    Hyperlipidemia    Hypertension    Hypertension    Kidney disease    Due to HTN, Stage 5   Plantar fasciitis    Pneumonia    Renal insufficiency     Past Surgical History:  Procedure Laterality Date   AV FISTULA PLACEMENT Right 02/09/2020   Procedure: RIGHT ARTERIOVENOUS FISTULA CREATION;  Surgeon: Serafina Mitchell, MD;   Location: [place];  Service: Vascular;  Laterality: Right;   BIOPSY  06/08/2021   Procedure: BIOPSY;  Surgeon: Harvel Quale, MD;  [facility];  Service: Gastroenterology;;   COLONOSCOPY WITH PROPOFOL N/A 06/08/2021   Procedure: COLONOSCOPY WITH PROPOFOL;  Surgeon: Harvel Quale, MD;  [facility];  Service: Gastroenterology;  Laterality: N/A;  1:35   ESOPHAGOGASTRODUODENOSCOPY (EGD) WITH PROPOFOL N/A 06/08/2021   Procedure: ESOPHAGOGASTRODUODENOSCOPY (EGD) WITH PROPOFOL;  Surgeon: Harvel Quale, MD;  [facility];  Service: Gastroenterology;  Laterality: N/A;   HERNIA REPAIR     PERIPHERAL VASCULAR BALLOON ANGIOPLASTY Right 04/10/2021   Procedure: PERIPHERAL VASCULAR BALLOON ANGIOPLASTY;  Surgeon: Serafina Mitchell, MD;  Location: [facility];  Service: Cardiovascular;  Laterality: Right;  Arm fistual   PYLOROPLASTY      Current Medications: Current Meds  Medication Sig   bisoprolol-hydrochlorothiazide (ZIAC) 10-6.25 MG tablet Take 1 tablet by mouth in the morning.   calcitRIOL (ROCALTROL) 0.25 MCG capsule Take 0.25 mcg by mouth in the morning.   calcium acetate (PHOSLO) 667 MG capsule Take 667 mg by mouth 2 (two) times daily with a meal.   cinacalcet (SENSIPAR) 30 MG tablet Take 30 mg by mouth daily.   cloNIDine (CATAPRES) 0.2 MG tablet Take 0.2 mg by mouth 2 (two) times daily.   dicyclomine (BENTYL) 10 MG  capsule Take 1 capsule (10 mg total) by mouth every 12 (twelve) hours as needed (abdominal pain).   furosemide (LASIX) 20 MG tablet Take 20 mg by mouth in the morning.   hydrALAZINE (APRESOLINE) 50 MG tablet Take 50 mg by mouth 3 (three) times daily.   NIFEdipine (ADALAT CC) 90 MG 24 hr tablet Take 90 mg by mouth in the morning.     Allergies:   Patient has no known allergies.   Social History   Socioeconomic History   Marital status: Married    Spouse name: Not on file   Number of children: Not on file   Years  of education: Not on file   Highest education level: Not on file  Occupational History   Occupation: Unemployed    Employer: UNEMPLOYED  Tobacco Use   Smoking status: Some Days    Packs/day: 0.50    Types: Cigarettes   Smokeless tobacco: Never  Vaping Use   Vaping Use: Never used  Substance and Sexual Activity   Alcohol use: Not Currently    Comment: rare   Drug use: Not Currently    Types: Marijuana   Sexual activity: Not on file  Other Topics Concern   Not on file  Social History Narrative   Domestic partner   Social Determinants of Health   Financial Resource Strain: Not on file  Food Insecurity: Not on file  Transportation Needs: Not on file  Physical Activity: Not on file  Stress: Not on file  Social Connections: Not on file     Family History: The patient's family history includes Diabetes in his father; Hypertension in his father and mother; Sarcoidosis in his sister.  ROS:   Please see the history of present illness.     All other systems reviewed and are negative.  EKGs/Labs/Other Studies Reviewed:    The following studies were reviewed today:   EKG:   07/09/2022: Sinus rhythm, rate 71, LVH with repolarization abnormalities, QTc 491  Recent Labs: No results found for requested labs within last 365 days.  Recent Lipid Panel No results found for: "CHOL", "TRIG", "HDL", "CHOLHDL", "VLDL", "LDLCALC", "LDLDIRECT"  Physical Exam:    VS:  BP 132/78   Pulse 74   Ht '6\' 5"'  (1.956 m)   Wt 200 lb (90.7 kg)   SpO2 99%   BMI 23.72 kg/m     Wt Readings from Last 3 Encounters:  07/09/22 200 lb (90.7 kg)  10/26/21 194 lb 6.4 oz (88.2 kg)  06/06/21 196 lb (88.9 kg)     GEN:  Well nourished, well developed in no acute distress HEENT: Normal NECK: No JVD; No carotid bruits LYMPHATICS: No lymphadenopathy CARDIAC: RRR, no murmurs, rubs, gallops RESPIRATORY:  Clear to auscultation without rales, wheezing or rhonchi  ABDOMEN: Soft, non-tender,  non-distended MUSCULOSKELETAL:  No edema; No deformity  SKIN: Warm and dry NEUROLOGIC:  Alert and oriented x 3 PSYCHIATRIC:  Normal affect   ASSESSMENT:    1. Pre-op evaluation   2. Chest pain, unspecified type   3. LVH (left ventricular hypertrophy)   4. Palpitations   5. Essential hypertension   6. Tobacco use     PLAN:    Preop evaluation: Prior to kidney transplant.  Needed stress test prior to being listed.  He does report atypical chest pain.  Echocardiogram 09/21/2021 showed EF 50 to 55%, severe LVH, grade 3 diastolic dysfunction, normal RV function, severe left atrial dilatation, mild AI.  Lexiscan Myoview on 11/16/2021 showed predominantly fixed  inferior perfusion defect with normal wall motion, suspected diaphragmatic attenuation artifact.  EF 45% on stress test, suspected artifact given normal on echo. -Low risk stress test, no further work-up recommended prior to procedure  Palpitations: Description concerning for arrhythmia, will evaluate with Zio patch x2 weeks  LVH: Echocardiogram 09/21/2021 showed EF 50 to 55%, severe LVH, grade 3 diastolic dysfunction, normal RV function, severe left atrial dilatation, mild AI.  LVH likely secondary to poorly controlled hypertension.  Recommend ruling out amyloidosis.  Multiple myeloma panel unremarkable.  Recommend PYP scan to rule out ATTR amyloid   ESRD: On HD, being evaluated for renal transplant.  Need to quit smoking to be listed for transplant  Hypertension: On nifedipine 90 mg daily, Lasix 20 mg daily, hydralazine 50 mg 3 times daily, clonidine 0.2 mg twice daily, bisoprolol-HCTZ 10-6.25 mg daily.  Appears controlled  Tobacco use: Patient counseled on the risk of tobacco use and cessation strongly recommended  RTC in 6 months   Medication Adjustments/Labs and Tests Ordered: Current medicines are reviewed at length with the patient today.  Concerns regarding medicines are outlined above.  Orders Placed This Encounter   Procedures   LONG TERM MONITOR (3-14 DAYS)   MYOCARDIAL AMYLOID IMAGING PLANAR & SPECT   EKG 12-Lead   No orders of the defined types were placed in this encounter.    Patient Instructions  Medication Instructions:  Your physician recommends that you continue on your current medications as directed. Please refer to the Current Medication list given to you today.   Labwork: None today  Testing/Procedures:  Schedule PYP Scan at the [facility] office      [place] Monitor Instructions    Your physician has requested you wear your ZIO patch monitor____14___days.   This is a single patch monitor.  [employer] supplies one patch monitor per enrollment.  Additional stickers are not available.   Please do not apply patch if you will be having a Nuclear Stress Test, Echocardiogram, Cardiac CT, MRI, or Chest Xray during the time frame you would be wearing the monitor. The patch cannot be worn during these tests.  You cannot remove and re-apply the ZIO XT patch monitor.       Do not shower for the first 24 hours.  You may shower after the first 24 hours.   Press button if you feel a symptom. You will hear a small click.  Record Date, Time and Symptom in the Patient Log Book.   When you are ready to remove patch, follow instructions on last 2 pages of Patient Log Book.  Stick patch monitor onto last page of Patient Log Book.   Place Patient Log Book in [place] box.  Use locking tab on box and tape box closed securely.  The [employer] has [employer] on it.  Please place in mailbox as soon as possible.  Your physician should have your test results approximately 7 days after the monitor has been mailed back to [facility].   Call [place] at [phone] if you have questions regarding your ZIO XT patch monitor.  Call them immediately if you see an orange light blinking on your monitor.   If your monitor falls off in less than 4 days contact our  Monitor department at [phone].  If your monitor becomes loose or falls off after 4 days call [employer] at [phone] for suggestions on securing your monitor.    Follow-Up: 6 months [place] office with Dr.Tinita Brooker  Any Other Special Instructions Will Be Listed Below (If Applicable).  If you need a refill on your cardiac medications before your next appointment, please call your pharmacy.    Signed, Donato Heinz, MD  07/09/2022 2:44 PM    [place]

## 2022-07-08 DIAGNOSIS — Z992 Dependence on renal dialysis: Secondary | ICD-10-CM | POA: Diagnosis not present

## 2022-07-08 DIAGNOSIS — N186 End stage renal disease: Secondary | ICD-10-CM | POA: Diagnosis not present

## 2022-07-09 ENCOUNTER — Ambulatory Visit: Payer: Medicare Other | Admitting: Cardiology

## 2022-07-09 ENCOUNTER — Ambulatory Visit (INDEPENDENT_AMBULATORY_CARE_PROVIDER_SITE_OTHER): Payer: Medicare Other

## 2022-07-09 ENCOUNTER — Encounter: Payer: Self-pay | Admitting: Cardiology

## 2022-07-09 VITALS — BP 132/78 | HR 74 | Ht 77.0 in | Wt 200.0 lb

## 2022-07-09 DIAGNOSIS — R002 Palpitations: Secondary | ICD-10-CM | POA: Diagnosis not present

## 2022-07-09 DIAGNOSIS — N250 Renal osteodystrophy: Secondary | ICD-10-CM | POA: Diagnosis not present

## 2022-07-09 DIAGNOSIS — I517 Cardiomegaly: Secondary | ICD-10-CM

## 2022-07-09 DIAGNOSIS — R079 Chest pain, unspecified: Secondary | ICD-10-CM | POA: Diagnosis not present

## 2022-07-09 DIAGNOSIS — Z720 Tobacco use: Secondary | ICD-10-CM

## 2022-07-09 DIAGNOSIS — N186 End stage renal disease: Secondary | ICD-10-CM | POA: Diagnosis not present

## 2022-07-09 DIAGNOSIS — Z992 Dependence on renal dialysis: Secondary | ICD-10-CM | POA: Diagnosis not present

## 2022-07-09 DIAGNOSIS — I10 Essential (primary) hypertension: Secondary | ICD-10-CM | POA: Diagnosis not present

## 2022-07-09 DIAGNOSIS — Z01818 Encounter for other preprocedural examination: Secondary | ICD-10-CM | POA: Diagnosis not present

## 2022-07-09 DIAGNOSIS — N2581 Secondary hyperparathyroidism of renal origin: Secondary | ICD-10-CM | POA: Diagnosis not present

## 2022-07-09 DIAGNOSIS — E559 Vitamin D deficiency, unspecified: Secondary | ICD-10-CM | POA: Diagnosis not present

## 2022-07-09 NOTE — Patient Instructions (Addendum)
Medication Instructions:  Your physician recommends that you continue on your current medications as directed. Please refer to the Current Medication list given to you today.   Labwork: None today  Testing/Procedures:  Schedule PYP Scan at the [facility] office      [place] Monitor Instructions    Your physician has requested you wear your ZIO patch monitor____14___days.   This is a single patch monitor.  [employer] supplies one patch monitor per enrollment.  Additional stickers are not available.   Please do not apply patch if you will be having a Nuclear Stress Test, Echocardiogram, Cardiac CT, MRI, or Chest Xray during the time frame you would be wearing the monitor. The patch cannot be worn during these tests.  You cannot remove and re-apply the ZIO XT patch monitor.       Do not shower for the first 24 hours.  You may shower after the first 24 hours.   Press button if you feel a symptom. You will hear a small click.  Record Date, Time and Symptom in the Patient Log Book.   When you are ready to remove patch, follow instructions on last 2 pages of Patient Log Book.  Stick patch monitor onto last page of Patient Log Book.   Place Patient Log Book in [place] box.  Use locking tab on box and tape box closed securely.  The [employer] has [employer] on it.  Please place in mailbox as soon as possible.  Your physician should have your test results approximately 7 days after the monitor has been mailed back to [facility].   Call [place] at [phone] if you have questions regarding your ZIO XT patch monitor.  Call them immediately if you see an orange light blinking on your monitor.   If your monitor falls off in less than 4 days contact our Monitor department at [phone].  If your monitor becomes loose or falls off after 4 days call [employer] at [phone] for suggestions on securing your monitor.    Follow-Up: 6 months  [place] office with Dr.Schumann  Any Other Special Instructions Will Be Listed Below (If Applicable).  If you need a refill on your cardiac medications before your next appointment, please call your pharmacy.

## 2022-07-11 DIAGNOSIS — E559 Vitamin D deficiency, unspecified: Secondary | ICD-10-CM | POA: Diagnosis not present

## 2022-07-11 DIAGNOSIS — Z992 Dependence on renal dialysis: Secondary | ICD-10-CM | POA: Diagnosis not present

## 2022-07-11 DIAGNOSIS — N250 Renal osteodystrophy: Secondary | ICD-10-CM | POA: Diagnosis not present

## 2022-07-11 DIAGNOSIS — N2581 Secondary hyperparathyroidism of renal origin: Secondary | ICD-10-CM | POA: Diagnosis not present

## 2022-07-11 DIAGNOSIS — N186 End stage renal disease: Secondary | ICD-10-CM | POA: Diagnosis not present

## 2022-07-13 DIAGNOSIS — N2581 Secondary hyperparathyroidism of renal origin: Secondary | ICD-10-CM | POA: Diagnosis not present

## 2022-07-13 DIAGNOSIS — N250 Renal osteodystrophy: Secondary | ICD-10-CM | POA: Diagnosis not present

## 2022-07-13 DIAGNOSIS — Z992 Dependence on renal dialysis: Secondary | ICD-10-CM | POA: Diagnosis not present

## 2022-07-13 DIAGNOSIS — E559 Vitamin D deficiency, unspecified: Secondary | ICD-10-CM | POA: Diagnosis not present

## 2022-07-13 DIAGNOSIS — N186 End stage renal disease: Secondary | ICD-10-CM | POA: Diagnosis not present

## 2022-07-16 DIAGNOSIS — Z992 Dependence on renal dialysis: Secondary | ICD-10-CM | POA: Diagnosis not present

## 2022-07-16 DIAGNOSIS — N250 Renal osteodystrophy: Secondary | ICD-10-CM | POA: Diagnosis not present

## 2022-07-16 DIAGNOSIS — N186 End stage renal disease: Secondary | ICD-10-CM | POA: Diagnosis not present

## 2022-07-16 DIAGNOSIS — E559 Vitamin D deficiency, unspecified: Secondary | ICD-10-CM | POA: Diagnosis not present

## 2022-07-16 DIAGNOSIS — N2581 Secondary hyperparathyroidism of renal origin: Secondary | ICD-10-CM | POA: Diagnosis not present

## 2022-07-19 DIAGNOSIS — E559 Vitamin D deficiency, unspecified: Secondary | ICD-10-CM | POA: Diagnosis not present

## 2022-07-19 DIAGNOSIS — N250 Renal osteodystrophy: Secondary | ICD-10-CM | POA: Diagnosis not present

## 2022-07-19 DIAGNOSIS — Z992 Dependence on renal dialysis: Secondary | ICD-10-CM | POA: Diagnosis not present

## 2022-07-19 DIAGNOSIS — N186 End stage renal disease: Secondary | ICD-10-CM | POA: Diagnosis not present

## 2022-07-19 DIAGNOSIS — N2581 Secondary hyperparathyroidism of renal origin: Secondary | ICD-10-CM | POA: Diagnosis not present

## 2022-07-23 DIAGNOSIS — N250 Renal osteodystrophy: Secondary | ICD-10-CM | POA: Diagnosis not present

## 2022-07-23 DIAGNOSIS — E559 Vitamin D deficiency, unspecified: Secondary | ICD-10-CM | POA: Diagnosis not present

## 2022-07-23 DIAGNOSIS — N2581 Secondary hyperparathyroidism of renal origin: Secondary | ICD-10-CM | POA: Diagnosis not present

## 2022-07-23 DIAGNOSIS — N186 End stage renal disease: Secondary | ICD-10-CM | POA: Diagnosis not present

## 2022-07-23 DIAGNOSIS — Z992 Dependence on renal dialysis: Secondary | ICD-10-CM | POA: Diagnosis not present

## 2022-07-25 ENCOUNTER — Telehealth (HOSPITAL_COMMUNITY): Payer: Self-pay | Admitting: *Deleted

## 2022-07-25 DIAGNOSIS — N186 End stage renal disease: Secondary | ICD-10-CM | POA: Diagnosis not present

## 2022-07-25 DIAGNOSIS — N2581 Secondary hyperparathyroidism of renal origin: Secondary | ICD-10-CM | POA: Diagnosis not present

## 2022-07-25 DIAGNOSIS — E559 Vitamin D deficiency, unspecified: Secondary | ICD-10-CM | POA: Diagnosis not present

## 2022-07-25 DIAGNOSIS — N250 Renal osteodystrophy: Secondary | ICD-10-CM | POA: Diagnosis not present

## 2022-07-25 DIAGNOSIS — Z992 Dependence on renal dialysis: Secondary | ICD-10-CM | POA: Diagnosis not present

## 2022-07-25 NOTE — Telephone Encounter (Signed)
Close encounter 

## 2022-07-26 ENCOUNTER — Ambulatory Visit (HOSPITAL_COMMUNITY)
Admission: RE | Admit: 2022-07-26 | Discharge: 2022-07-26 | Disposition: A | Discharge status: Home / Self Care | Payer: Medicare Other | Source: Ambulatory Visit | Attending: Cardiovascular Disease | Admitting: Cardiovascular Disease

## 2022-07-26 DIAGNOSIS — I517 Cardiomegaly: Secondary | ICD-10-CM | POA: Diagnosis not present

## 2022-07-26 MED ORDER — TECHNETIUM TC 99M PYROPHOSPHATE
20.1000 | Freq: Once | INTRAVENOUS | Status: AC
  Administered 2022-07-26: 20.1 via INTRAVENOUS

## 2022-07-27 DIAGNOSIS — E559 Vitamin D deficiency, unspecified: Secondary | ICD-10-CM | POA: Diagnosis not present

## 2022-07-27 DIAGNOSIS — Z992 Dependence on renal dialysis: Secondary | ICD-10-CM | POA: Diagnosis not present

## 2022-07-27 DIAGNOSIS — N250 Renal osteodystrophy: Secondary | ICD-10-CM | POA: Diagnosis not present

## 2022-07-27 DIAGNOSIS — N2581 Secondary hyperparathyroidism of renal origin: Secondary | ICD-10-CM | POA: Diagnosis not present

## 2022-07-27 DIAGNOSIS — N186 End stage renal disease: Secondary | ICD-10-CM | POA: Diagnosis not present

## 2022-07-30 DIAGNOSIS — E559 Vitamin D deficiency, unspecified: Secondary | ICD-10-CM | POA: Diagnosis not present

## 2022-07-30 DIAGNOSIS — N2581 Secondary hyperparathyroidism of renal origin: Secondary | ICD-10-CM | POA: Diagnosis not present

## 2022-07-30 DIAGNOSIS — N186 End stage renal disease: Secondary | ICD-10-CM | POA: Diagnosis not present

## 2022-07-30 DIAGNOSIS — Z992 Dependence on renal dialysis: Secondary | ICD-10-CM | POA: Diagnosis not present

## 2022-07-30 DIAGNOSIS — N250 Renal osteodystrophy: Secondary | ICD-10-CM | POA: Diagnosis not present

## 2022-08-01 DIAGNOSIS — E559 Vitamin D deficiency, unspecified: Secondary | ICD-10-CM | POA: Diagnosis not present

## 2022-08-01 DIAGNOSIS — R002 Palpitations: Secondary | ICD-10-CM | POA: Diagnosis not present

## 2022-08-01 DIAGNOSIS — N250 Renal osteodystrophy: Secondary | ICD-10-CM | POA: Diagnosis not present

## 2022-08-01 DIAGNOSIS — N2581 Secondary hyperparathyroidism of renal origin: Secondary | ICD-10-CM | POA: Diagnosis not present

## 2022-08-01 DIAGNOSIS — N186 End stage renal disease: Secondary | ICD-10-CM | POA: Diagnosis not present

## 2022-08-01 DIAGNOSIS — Z992 Dependence on renal dialysis: Secondary | ICD-10-CM | POA: Diagnosis not present

## 2022-08-06 DIAGNOSIS — N2581 Secondary hyperparathyroidism of renal origin: Secondary | ICD-10-CM | POA: Diagnosis not present

## 2022-08-06 DIAGNOSIS — N250 Renal osteodystrophy: Secondary | ICD-10-CM | POA: Diagnosis not present

## 2022-08-06 DIAGNOSIS — E559 Vitamin D deficiency, unspecified: Secondary | ICD-10-CM | POA: Diagnosis not present

## 2022-08-06 DIAGNOSIS — Z992 Dependence on renal dialysis: Secondary | ICD-10-CM | POA: Diagnosis not present

## 2022-08-06 DIAGNOSIS — N186 End stage renal disease: Secondary | ICD-10-CM | POA: Diagnosis not present

## 2022-08-08 DIAGNOSIS — Z992 Dependence on renal dialysis: Secondary | ICD-10-CM | POA: Diagnosis not present

## 2022-08-08 DIAGNOSIS — N186 End stage renal disease: Secondary | ICD-10-CM | POA: Diagnosis not present

## 2022-08-08 DIAGNOSIS — N2581 Secondary hyperparathyroidism of renal origin: Secondary | ICD-10-CM | POA: Diagnosis not present

## 2022-08-08 DIAGNOSIS — N250 Renal osteodystrophy: Secondary | ICD-10-CM | POA: Diagnosis not present

## 2022-08-08 DIAGNOSIS — E559 Vitamin D deficiency, unspecified: Secondary | ICD-10-CM | POA: Diagnosis not present

## 2022-08-10 DIAGNOSIS — N250 Renal osteodystrophy: Secondary | ICD-10-CM | POA: Diagnosis not present

## 2022-08-10 DIAGNOSIS — Z992 Dependence on renal dialysis: Secondary | ICD-10-CM | POA: Diagnosis not present

## 2022-08-10 DIAGNOSIS — N186 End stage renal disease: Secondary | ICD-10-CM | POA: Diagnosis not present

## 2022-08-10 DIAGNOSIS — E559 Vitamin D deficiency, unspecified: Secondary | ICD-10-CM | POA: Diagnosis not present

## 2022-08-10 DIAGNOSIS — N2581 Secondary hyperparathyroidism of renal origin: Secondary | ICD-10-CM | POA: Diagnosis not present

## 2022-08-13 DIAGNOSIS — N2581 Secondary hyperparathyroidism of renal origin: Secondary | ICD-10-CM | POA: Diagnosis not present

## 2022-08-13 DIAGNOSIS — N186 End stage renal disease: Secondary | ICD-10-CM | POA: Diagnosis not present

## 2022-08-13 DIAGNOSIS — N250 Renal osteodystrophy: Secondary | ICD-10-CM | POA: Diagnosis not present

## 2022-08-13 DIAGNOSIS — E559 Vitamin D deficiency, unspecified: Secondary | ICD-10-CM | POA: Diagnosis not present

## 2022-08-13 DIAGNOSIS — Z992 Dependence on renal dialysis: Secondary | ICD-10-CM | POA: Diagnosis not present

## 2022-08-15 DIAGNOSIS — N250 Renal osteodystrophy: Secondary | ICD-10-CM | POA: Diagnosis not present

## 2022-08-15 DIAGNOSIS — Z992 Dependence on renal dialysis: Secondary | ICD-10-CM | POA: Diagnosis not present

## 2022-08-15 DIAGNOSIS — N2581 Secondary hyperparathyroidism of renal origin: Secondary | ICD-10-CM | POA: Diagnosis not present

## 2022-08-15 DIAGNOSIS — N186 End stage renal disease: Secondary | ICD-10-CM | POA: Diagnosis not present

## 2022-08-15 DIAGNOSIS — E559 Vitamin D deficiency, unspecified: Secondary | ICD-10-CM | POA: Diagnosis not present

## 2022-08-17 DIAGNOSIS — N2581 Secondary hyperparathyroidism of renal origin: Secondary | ICD-10-CM | POA: Diagnosis not present

## 2022-08-17 DIAGNOSIS — E559 Vitamin D deficiency, unspecified: Secondary | ICD-10-CM | POA: Diagnosis not present

## 2022-08-17 DIAGNOSIS — Z992 Dependence on renal dialysis: Secondary | ICD-10-CM | POA: Diagnosis not present

## 2022-08-17 DIAGNOSIS — N186 End stage renal disease: Secondary | ICD-10-CM | POA: Diagnosis not present

## 2022-08-17 DIAGNOSIS — N250 Renal osteodystrophy: Secondary | ICD-10-CM | POA: Diagnosis not present

## 2022-08-20 DIAGNOSIS — N186 End stage renal disease: Secondary | ICD-10-CM | POA: Diagnosis not present

## 2022-08-20 DIAGNOSIS — N2581 Secondary hyperparathyroidism of renal origin: Secondary | ICD-10-CM | POA: Diagnosis not present

## 2022-08-20 DIAGNOSIS — E559 Vitamin D deficiency, unspecified: Secondary | ICD-10-CM | POA: Diagnosis not present

## 2022-08-20 DIAGNOSIS — Z992 Dependence on renal dialysis: Secondary | ICD-10-CM | POA: Diagnosis not present

## 2022-08-20 DIAGNOSIS — N250 Renal osteodystrophy: Secondary | ICD-10-CM | POA: Diagnosis not present

## 2022-08-21 ENCOUNTER — Encounter: Payer: Self-pay | Admitting: *Deleted

## 2022-08-24 DIAGNOSIS — Z992 Dependence on renal dialysis: Secondary | ICD-10-CM | POA: Diagnosis not present

## 2022-08-24 DIAGNOSIS — N250 Renal osteodystrophy: Secondary | ICD-10-CM | POA: Diagnosis not present

## 2022-08-24 DIAGNOSIS — E559 Vitamin D deficiency, unspecified: Secondary | ICD-10-CM | POA: Diagnosis not present

## 2022-08-24 DIAGNOSIS — N2581 Secondary hyperparathyroidism of renal origin: Secondary | ICD-10-CM | POA: Diagnosis not present

## 2022-08-24 DIAGNOSIS — N186 End stage renal disease: Secondary | ICD-10-CM | POA: Diagnosis not present

## 2022-08-27 DIAGNOSIS — N250 Renal osteodystrophy: Secondary | ICD-10-CM | POA: Diagnosis not present

## 2022-08-27 DIAGNOSIS — N2581 Secondary hyperparathyroidism of renal origin: Secondary | ICD-10-CM | POA: Diagnosis not present

## 2022-08-27 DIAGNOSIS — Z992 Dependence on renal dialysis: Secondary | ICD-10-CM | POA: Diagnosis not present

## 2022-08-27 DIAGNOSIS — E559 Vitamin D deficiency, unspecified: Secondary | ICD-10-CM | POA: Diagnosis not present

## 2022-08-27 DIAGNOSIS — N186 End stage renal disease: Secondary | ICD-10-CM | POA: Diagnosis not present

## 2022-08-29 DIAGNOSIS — N2581 Secondary hyperparathyroidism of renal origin: Secondary | ICD-10-CM | POA: Diagnosis not present

## 2022-08-29 DIAGNOSIS — E559 Vitamin D deficiency, unspecified: Secondary | ICD-10-CM | POA: Diagnosis not present

## 2022-08-29 DIAGNOSIS — N250 Renal osteodystrophy: Secondary | ICD-10-CM | POA: Diagnosis not present

## 2022-08-29 DIAGNOSIS — N186 End stage renal disease: Secondary | ICD-10-CM | POA: Diagnosis not present

## 2022-08-29 DIAGNOSIS — Z992 Dependence on renal dialysis: Secondary | ICD-10-CM | POA: Diagnosis not present

## 2022-08-31 DIAGNOSIS — N2581 Secondary hyperparathyroidism of renal origin: Secondary | ICD-10-CM | POA: Diagnosis not present

## 2022-08-31 DIAGNOSIS — N186 End stage renal disease: Secondary | ICD-10-CM | POA: Diagnosis not present

## 2022-08-31 DIAGNOSIS — E559 Vitamin D deficiency, unspecified: Secondary | ICD-10-CM | POA: Diagnosis not present

## 2022-08-31 DIAGNOSIS — N250 Renal osteodystrophy: Secondary | ICD-10-CM | POA: Diagnosis not present

## 2022-08-31 DIAGNOSIS — Z992 Dependence on renal dialysis: Secondary | ICD-10-CM | POA: Diagnosis not present

## 2022-09-03 DIAGNOSIS — N250 Renal osteodystrophy: Secondary | ICD-10-CM | POA: Diagnosis not present

## 2022-09-03 DIAGNOSIS — N186 End stage renal disease: Secondary | ICD-10-CM | POA: Diagnosis not present

## 2022-09-03 DIAGNOSIS — E559 Vitamin D deficiency, unspecified: Secondary | ICD-10-CM | POA: Diagnosis not present

## 2022-09-03 DIAGNOSIS — Z992 Dependence on renal dialysis: Secondary | ICD-10-CM | POA: Diagnosis not present

## 2022-09-03 DIAGNOSIS — N2581 Secondary hyperparathyroidism of renal origin: Secondary | ICD-10-CM | POA: Diagnosis not present

## 2022-09-07 DIAGNOSIS — N186 End stage renal disease: Secondary | ICD-10-CM | POA: Diagnosis not present

## 2022-09-07 DIAGNOSIS — N2581 Secondary hyperparathyroidism of renal origin: Secondary | ICD-10-CM | POA: Diagnosis not present

## 2022-09-07 DIAGNOSIS — Z992 Dependence on renal dialysis: Secondary | ICD-10-CM | POA: Diagnosis not present

## 2022-09-07 DIAGNOSIS — E559 Vitamin D deficiency, unspecified: Secondary | ICD-10-CM | POA: Diagnosis not present

## 2022-09-07 DIAGNOSIS — N250 Renal osteodystrophy: Secondary | ICD-10-CM | POA: Diagnosis not present

## 2022-09-11 DIAGNOSIS — Z992 Dependence on renal dialysis: Secondary | ICD-10-CM | POA: Diagnosis not present

## 2022-09-11 DIAGNOSIS — E559 Vitamin D deficiency, unspecified: Secondary | ICD-10-CM | POA: Diagnosis not present

## 2022-09-11 DIAGNOSIS — Z23 Encounter for immunization: Secondary | ICD-10-CM | POA: Diagnosis not present

## 2022-09-11 DIAGNOSIS — N2581 Secondary hyperparathyroidism of renal origin: Secondary | ICD-10-CM | POA: Diagnosis not present

## 2022-09-11 DIAGNOSIS — N186 End stage renal disease: Secondary | ICD-10-CM | POA: Diagnosis not present

## 2022-09-11 DIAGNOSIS — N250 Renal osteodystrophy: Secondary | ICD-10-CM | POA: Diagnosis not present

## 2022-09-12 DIAGNOSIS — E559 Vitamin D deficiency, unspecified: Secondary | ICD-10-CM | POA: Diagnosis not present

## 2022-09-12 DIAGNOSIS — Z992 Dependence on renal dialysis: Secondary | ICD-10-CM | POA: Diagnosis not present

## 2022-09-12 DIAGNOSIS — N250 Renal osteodystrophy: Secondary | ICD-10-CM | POA: Diagnosis not present

## 2022-09-12 DIAGNOSIS — N186 End stage renal disease: Secondary | ICD-10-CM | POA: Diagnosis not present

## 2022-09-12 DIAGNOSIS — N2581 Secondary hyperparathyroidism of renal origin: Secondary | ICD-10-CM | POA: Diagnosis not present

## 2022-09-12 DIAGNOSIS — Z23 Encounter for immunization: Secondary | ICD-10-CM | POA: Diagnosis not present

## 2022-09-14 DIAGNOSIS — N186 End stage renal disease: Secondary | ICD-10-CM | POA: Diagnosis not present

## 2022-09-14 DIAGNOSIS — Z992 Dependence on renal dialysis: Secondary | ICD-10-CM | POA: Diagnosis not present

## 2022-09-14 DIAGNOSIS — E559 Vitamin D deficiency, unspecified: Secondary | ICD-10-CM | POA: Diagnosis not present

## 2022-09-14 DIAGNOSIS — Z23 Encounter for immunization: Secondary | ICD-10-CM | POA: Diagnosis not present

## 2022-09-14 DIAGNOSIS — N2581 Secondary hyperparathyroidism of renal origin: Secondary | ICD-10-CM | POA: Diagnosis not present

## 2022-09-14 DIAGNOSIS — N250 Renal osteodystrophy: Secondary | ICD-10-CM | POA: Diagnosis not present

## 2022-09-17 DIAGNOSIS — Z23 Encounter for immunization: Secondary | ICD-10-CM | POA: Diagnosis not present

## 2022-09-17 DIAGNOSIS — N2581 Secondary hyperparathyroidism of renal origin: Secondary | ICD-10-CM | POA: Diagnosis not present

## 2022-09-17 DIAGNOSIS — N186 End stage renal disease: Secondary | ICD-10-CM | POA: Diagnosis not present

## 2022-09-17 DIAGNOSIS — Z992 Dependence on renal dialysis: Secondary | ICD-10-CM | POA: Diagnosis not present

## 2022-09-17 DIAGNOSIS — N250 Renal osteodystrophy: Secondary | ICD-10-CM | POA: Diagnosis not present

## 2022-09-17 DIAGNOSIS — E559 Vitamin D deficiency, unspecified: Secondary | ICD-10-CM | POA: Diagnosis not present

## 2022-09-19 DIAGNOSIS — N2581 Secondary hyperparathyroidism of renal origin: Secondary | ICD-10-CM | POA: Diagnosis not present

## 2022-09-19 DIAGNOSIS — E559 Vitamin D deficiency, unspecified: Secondary | ICD-10-CM | POA: Diagnosis not present

## 2022-09-19 DIAGNOSIS — Z23 Encounter for immunization: Secondary | ICD-10-CM | POA: Diagnosis not present

## 2022-09-19 DIAGNOSIS — N186 End stage renal disease: Secondary | ICD-10-CM | POA: Diagnosis not present

## 2022-09-19 DIAGNOSIS — Z992 Dependence on renal dialysis: Secondary | ICD-10-CM | POA: Diagnosis not present

## 2022-09-19 DIAGNOSIS — N250 Renal osteodystrophy: Secondary | ICD-10-CM | POA: Diagnosis not present

## 2022-09-21 DIAGNOSIS — Z23 Encounter for immunization: Secondary | ICD-10-CM | POA: Diagnosis not present

## 2022-09-21 DIAGNOSIS — E559 Vitamin D deficiency, unspecified: Secondary | ICD-10-CM | POA: Diagnosis not present

## 2022-09-21 DIAGNOSIS — N2581 Secondary hyperparathyroidism of renal origin: Secondary | ICD-10-CM | POA: Diagnosis not present

## 2022-09-21 DIAGNOSIS — N250 Renal osteodystrophy: Secondary | ICD-10-CM | POA: Diagnosis not present

## 2022-09-21 DIAGNOSIS — Z992 Dependence on renal dialysis: Secondary | ICD-10-CM | POA: Diagnosis not present

## 2022-09-21 DIAGNOSIS — N186 End stage renal disease: Secondary | ICD-10-CM | POA: Diagnosis not present

## 2022-09-24 ENCOUNTER — Other Ambulatory Visit: Payer: Self-pay | Admitting: *Deleted

## 2022-09-24 DIAGNOSIS — N186 End stage renal disease: Secondary | ICD-10-CM

## 2022-09-24 DIAGNOSIS — N250 Renal osteodystrophy: Secondary | ICD-10-CM | POA: Diagnosis not present

## 2022-09-24 DIAGNOSIS — N2581 Secondary hyperparathyroidism of renal origin: Secondary | ICD-10-CM | POA: Diagnosis not present

## 2022-09-24 DIAGNOSIS — Z23 Encounter for immunization: Secondary | ICD-10-CM | POA: Diagnosis not present

## 2022-09-24 DIAGNOSIS — Z992 Dependence on renal dialysis: Secondary | ICD-10-CM | POA: Diagnosis not present

## 2022-09-24 DIAGNOSIS — E559 Vitamin D deficiency, unspecified: Secondary | ICD-10-CM | POA: Diagnosis not present

## 2022-09-26 DIAGNOSIS — N186 End stage renal disease: Secondary | ICD-10-CM | POA: Diagnosis not present

## 2022-09-26 DIAGNOSIS — N250 Renal osteodystrophy: Secondary | ICD-10-CM | POA: Diagnosis not present

## 2022-09-26 DIAGNOSIS — E559 Vitamin D deficiency, unspecified: Secondary | ICD-10-CM | POA: Diagnosis not present

## 2022-09-26 DIAGNOSIS — Z992 Dependence on renal dialysis: Secondary | ICD-10-CM | POA: Diagnosis not present

## 2022-09-26 DIAGNOSIS — Z23 Encounter for immunization: Secondary | ICD-10-CM | POA: Diagnosis not present

## 2022-09-26 DIAGNOSIS — N2581 Secondary hyperparathyroidism of renal origin: Secondary | ICD-10-CM | POA: Diagnosis not present

## 2022-09-28 DIAGNOSIS — N250 Renal osteodystrophy: Secondary | ICD-10-CM | POA: Diagnosis not present

## 2022-09-28 DIAGNOSIS — N186 End stage renal disease: Secondary | ICD-10-CM | POA: Diagnosis not present

## 2022-09-28 DIAGNOSIS — Z23 Encounter for immunization: Secondary | ICD-10-CM | POA: Diagnosis not present

## 2022-09-28 DIAGNOSIS — N2581 Secondary hyperparathyroidism of renal origin: Secondary | ICD-10-CM | POA: Diagnosis not present

## 2022-09-28 DIAGNOSIS — Z992 Dependence on renal dialysis: Secondary | ICD-10-CM | POA: Diagnosis not present

## 2022-09-28 DIAGNOSIS — E559 Vitamin D deficiency, unspecified: Secondary | ICD-10-CM | POA: Diagnosis not present

## 2022-10-01 DIAGNOSIS — N2581 Secondary hyperparathyroidism of renal origin: Secondary | ICD-10-CM | POA: Diagnosis not present

## 2022-10-01 DIAGNOSIS — E559 Vitamin D deficiency, unspecified: Secondary | ICD-10-CM | POA: Diagnosis not present

## 2022-10-01 DIAGNOSIS — N250 Renal osteodystrophy: Secondary | ICD-10-CM | POA: Diagnosis not present

## 2022-10-01 DIAGNOSIS — Z23 Encounter for immunization: Secondary | ICD-10-CM | POA: Diagnosis not present

## 2022-10-01 DIAGNOSIS — N186 End stage renal disease: Secondary | ICD-10-CM | POA: Diagnosis not present

## 2022-10-01 DIAGNOSIS — Z992 Dependence on renal dialysis: Secondary | ICD-10-CM | POA: Diagnosis not present

## 2022-10-01 NOTE — H&P (View-Only) (Signed)
HISTORY AND PHYSICAL     CC:  dialysis access Requesting Provider:  Hyler, Gwen Her, NP  HPI: This is a 46 y.o. male here for evaluation of his hemodialysis access.  Pt has hx of right BC AVF on 02/09/2020.  He presents today for evaluation of his fistula.  He states he has been having pain around the fistula for a couple of months.  He states it is an achy feeling and is not present all of the time.  He states that it does not necessarily happen while he is on dialysis as it is somewhat achy currently.  He gets some cramping in his hand at times where it is stiff.  He states that it is along the outside of the fistula from the aneurysmal area up.  He states the fistula is working well on HD.  He had good result from venoplasty last year.  He continues to smoke.  He states he has had some bilateral lower extremity weakness.    He denies any temporary blindness or speech difficulties.  He continues to smoke.   Dialysis access history: -right BC AVF 02/09/2020 Dr. Trula Slade -cephalic vein venoplasty 08/10/1193 Dr. Trula Slade   The pt is right hand dominant.    Pt is on dialysis.   Days of dialysis if applicable:  T/T/S    HD center:  [facility] location in [place]   The pt is not on a statin for cholesterol management.  The pt is not on a daily aspirin.  Other AC:  none The pt is on BB, HCTZ, CCB, diuretic for hypertension.  The pt does not diabetic.   Tobacco hx:  current  Past Medical History:  Diagnosis Date   Anxiety    Arthritis    Gout   Cardiomegaly    Depression    Dialysis patient ([place])    Dyspnea on exertion    Fracture, foot    Hyperlipidemia    Hypertension    Hypertension    Kidney disease    Due to HTN, Stage 5   Plantar fasciitis    Pneumonia    Renal insufficiency     Past Surgical History:  Procedure Laterality Date   AV FISTULA PLACEMENT Right 02/09/2020   Procedure: RIGHT ARTERIOVENOUS FISTULA CREATION;  Surgeon: Serafina Mitchell, MD;  Location: [place];  Service:  Vascular;  Laterality: Right;   BIOPSY  06/08/2021   Procedure: BIOPSY;  Surgeon: Harvel Quale, MD;  [facility];  Service: Gastroenterology;;   COLONOSCOPY WITH PROPOFOL N/A 06/08/2021   Procedure: COLONOSCOPY WITH PROPOFOL;  Surgeon: Harvel Quale, MD;  [facility];  Service: Gastroenterology;  Laterality: N/A;  1:35   ESOPHAGOGASTRODUODENOSCOPY (EGD) WITH PROPOFOL N/A 06/08/2021   Procedure: ESOPHAGOGASTRODUODENOSCOPY (EGD) WITH PROPOFOL;  Surgeon: Harvel Quale, MD;  [facility];  Service: Gastroenterology;  Laterality: N/A;   HERNIA REPAIR     PERIPHERAL VASCULAR BALLOON ANGIOPLASTY Right 04/10/2021   Procedure: PERIPHERAL VASCULAR BALLOON ANGIOPLASTY;  Surgeon: Serafina Mitchell, MD;  Location: [place] [facility];  Service: Cardiovascular;  Laterality: Right;  Arm fistual   PYLOROPLASTY      No Known Allergies  Current Outpatient Medications  Medication Sig Dispense Refill   allopurinol (ZYLOPRIM) 100 MG tablet Take 100 mg by mouth in the morning. (Patient not taking: Reported on 07/09/2022)     bisoprolol-hydrochlorothiazide (ZIAC) 10-6.25 MG tablet Take 1 tablet by mouth in the morning.     calcitRIOL (ROCALTROL)  0.25 MCG capsule Take 0.25 mcg by mouth in the morning.     calcium acetate (PHOSLO) 667 MG capsule Take 667 mg by mouth 2 (two) times daily with a meal.     cinacalcet (SENSIPAR) 30 MG tablet Take 30 mg by mouth daily.     cloNIDine (CATAPRES) 0.2 MG tablet Take 0.2 mg by mouth 2 (two) times daily.     dicyclomine (BENTYL) 10 MG capsule Take 1 capsule (10 mg total) by mouth every 12 (twelve) hours as needed (abdominal pain). 60 capsule 2   furosemide (LASIX) 20 MG tablet Take 20 mg by mouth in the morning.     hydrALAZINE (APRESOLINE) 50 MG tablet Take 50 mg by mouth 3 (three) times daily.     NIFEdipine (ADALAT CC) 90 MG 24 hr tablet Take 90 mg by mouth in the morning.     omeprazole (PRILOSEC) 40 MG capsule  Take 1 capsule (40 mg total) by mouth daily. (Patient not taking: Reported on 07/09/2022) 90 capsule 3   sodium bicarbonate 650 MG tablet Take 650 mg by mouth 3 (three) times daily. (Patient not taking: Reported on 07/09/2022)     No current facility-administered medications for this visit.    Family History  Problem Relation Age of Onset   Hypertension Mother    Hypertension Father    Diabetes Father    Sarcoidosis Sister     Social History   Socioeconomic History   Marital status: Married    Spouse name: Not on file   Number of children: Not on file   Years of education: Not on file   Highest education level: Not on file  Occupational History   Occupation: Unemployed    Employer: UNEMPLOYED  Tobacco Use   Smoking status: Some Days    Packs/day: 0.50    Types: Cigarettes   Smokeless tobacco: Never  Vaping Use   Vaping Use: Never used  Substance and Sexual Activity   Alcohol use: Not Currently    Comment: rare   Drug use: Not Currently    Types: Marijuana   Sexual activity: Not on file  Other Topics Concern   Not on file  Social History Narrative   Domestic partner   Social Determinants of Health   Financial Resource Strain: Not on file  Food Insecurity: Not on file  Transportation Needs: Not on file  Physical Activity: Not on file  Stress: Not on file  Social Connections: Not on file  Intimate Partner Violence: Not on file     ROS: [x]  Positive   [ ]  Negative   [ ]  All sytems reviewed and are negative  Cardiac: []  chest pain/pressure []  SOB/DOE  Vascular: []  pain in legs while walking []  pain in feet when lying flat []  swelling in legs  Pulmonary: []  asthma []  wheezing  Neurologic: []  hx CVA/TIA  Hematologic: []  bleeding problems  GI []  GERD  GU: [x]  CKD/renal failure  [x]  HD---[]  M/W/F [x]  T/T/S  Psychiatric: []  hx of major depression  Integumentary: []  rashes []  ulcers  Constitutional: []  fever []  chills   PHYSICAL  EXAMINATION:  Today's Vitals   10/02/22 0907  BP: (!) 174/88  Pulse: 71  Resp: 20  Temp: 98.4 F (36.9 C)  TempSrc: Temporal  SpO2: 100%  Weight: 200 lb 14.4 oz (91.1 kg)  Height: 6\' 5"  (1.956 m)  PainSc: 8   PainLoc: Arm   Body mass index is 23.82 kg/m.    General:  WDWN male in  NAD Gait: Not observed HENT: WNL Pulmonary: normal non-labored breathing  Cardiac: regular, without carotid bruits Skin: without rashes Vascular Exam/Pulses:   Right Left  Radial 2+ (normal) 2+ (normal)   Extremities:  Right arm fistula with good thrill.  There is aneurysmal area.  There is no shiny skin or ulcerations present Musculoskeletal: no muscle wasting or atrophy  Neurologic: A&O X 3  Non-Invasive Vascular Imaging:   Dialysis duplex on 10/17/202: Findings:  +--------------------+----------+-----------------+--------+  AVF                 PSV (cm/s)Flow Vol (mL/min)Comments  +--------------------+----------+-----------------+--------+  Native artery inflow   454          1939                 +--------------------+----------+-----------------+--------+  AVF Anastomosis        431                               +--------------------+----------+-----------------+--------+  +------------+----------+-------------+----------+------------------------+  OUTFLOW VEINPSV (cm/s)Diameter (cm)Depth (cm)        Describe          +------------+----------+-------------+----------+------------------------+  Prox UA        305        0.59        0.24         small branch        +------------+----------+-------------+----------+------------------------+  Mid UA         136        0.91        0.27                             +------------+----------+-------------+----------+------------------------+  Dist UA         70        2.07        0.35   branch 0.513 cm  mid/dist  +------------+----------+-------------+----------+------------------------+  AC Fossa        402        0.82        0.37                             +------------+----------+-------------+----------+------------------------+   ASSESSMENT/PLAN: 46 y.o. male with ESRD here for evaluation of his hemodialysis access with hx of right BC AVF with hx of venoplasty in 2022 here for pain around fistula   -pt does not have evidence of steal in the right hand.  He does have pain/achiness around the fistula.  Discussed findings above with Dr. Scot Dock and will schedule for fistulogram given increased velocities proximal and decreased velocities distally.  I discussed with pt that we will evaluate fistula with possible intervention but this may or may not help the pain achiness around the fistula.  He expressed understanding.   -pt is on dialysis on T/T/S -discussed with pt that access does not last forever and will need intervention or even new access at some point.  -pt is right hand dominant -pt is not on anticoagulation   Leontine Locket, [facility] [phone]  Clinic MD:   Donzetta Matters

## 2022-10-01 NOTE — Progress Notes (Unsigned)
HISTORY AND PHYSICAL     CC:  dialysis access Requesting Provider:  Hyler, Gwen Her, NP  HPI: This is a 46 y.o. male here for evaluation of his hemodialysis access.  Pt has hx of right BC AVF on 02/09/2020.  He presents today for evaluation of his fistula.  He states he has been having pain around the fistula for a couple of months.  He states it is an achy feeling and is not present all of the time.  He states that it does not necessarily happen while he is on dialysis as it is somewhat achy currently.  He gets some cramping in his hand at times where it is stiff.  He states that it is along the outside of the fistula from the aneurysmal area up.  He states the fistula is working well on HD.  He had good result from venoplasty last year.  He continues to smoke.  He states he has had some bilateral lower extremity weakness.    He denies any temporary blindness or speech difficulties.  He continues to smoke.   Dialysis access history: -right BC AVF 02/09/2020 Dr. Trula Slade -cephalic vein venoplasty 06/10/7105 Dr. Trula Slade   The pt is right hand dominant.    Pt is on dialysis.   Days of dialysis if applicable:  T/T/S    HD center:  [facility] location in [place]   The pt is not on a statin for cholesterol management.  The pt is not on a daily aspirin.  Other AC:  none The pt is on BB, HCTZ, CCB, diuretic for hypertension.  The pt does not diabetic.   Tobacco hx:  current  Past Medical History:  Diagnosis Date   Anxiety    Arthritis    Gout   Cardiomegaly    Depression    Dialysis patient ([place])    Dyspnea on exertion    Fracture, foot    Hyperlipidemia    Hypertension    Hypertension    Kidney disease    Due to HTN, Stage 5   Plantar fasciitis    Pneumonia    Renal insufficiency     Past Surgical History:  Procedure Laterality Date   AV FISTULA PLACEMENT Right 02/09/2020   Procedure: RIGHT ARTERIOVENOUS FISTULA CREATION;  Surgeon: Serafina Mitchell, MD;  Location: [place] [facility];  Service:  Vascular;  Laterality: Right;   BIOPSY  06/08/2021   Procedure: BIOPSY;  Surgeon: Harvel Quale, MD;  [facility];  Service: Gastroenterology;;   COLONOSCOPY WITH PROPOFOL N/A 06/08/2021   Procedure: COLONOSCOPY WITH PROPOFOL;  Surgeon: Harvel Quale, MD;  [facility];  Service: Gastroenterology;  Laterality: N/A;  1:35   ESOPHAGOGASTRODUODENOSCOPY (EGD) WITH PROPOFOL N/A 06/08/2021   Procedure: ESOPHAGOGASTRODUODENOSCOPY (EGD) WITH PROPOFOL;  Surgeon: Harvel Quale, MD;  [facility];  Service: Gastroenterology;  Laterality: N/A;   HERNIA REPAIR     PERIPHERAL VASCULAR BALLOON ANGIOPLASTY Right 04/10/2021   Procedure: PERIPHERAL VASCULAR BALLOON ANGIOPLASTY;  Surgeon: Serafina Mitchell, MD;  Location: [facility];  Service: Cardiovascular;  Laterality: Right;  Arm fistual   PYLOROPLASTY      No Known Allergies  Current Outpatient Medications  Medication Sig Dispense Refill   allopurinol (ZYLOPRIM) 100 MG tablet Take 100 mg by mouth in the morning. (Patient not taking: Reported on 07/09/2022)     bisoprolol-hydrochlorothiazide (ZIAC) 10-6.25 MG tablet Take 1 tablet by mouth in the morning.     calcitRIOL (ROCALTROL)  0.25 MCG capsule Take 0.25 mcg by mouth in the morning.     calcium acetate (PHOSLO) 667 MG capsule Take 667 mg by mouth 2 (two) times daily with a meal.     cinacalcet (SENSIPAR) 30 MG tablet Take 30 mg by mouth daily.     cloNIDine (CATAPRES) 0.2 MG tablet Take 0.2 mg by mouth 2 (two) times daily.     dicyclomine (BENTYL) 10 MG capsule Take 1 capsule (10 mg total) by mouth every 12 (twelve) hours as needed (abdominal pain). 60 capsule 2   furosemide (LASIX) 20 MG tablet Take 20 mg by mouth in the morning.     hydrALAZINE (APRESOLINE) 50 MG tablet Take 50 mg by mouth 3 (three) times daily.     NIFEdipine (ADALAT CC) 90 MG 24 hr tablet Take 90 mg by mouth in the morning.     omeprazole (PRILOSEC) 40 MG capsule  Take 1 capsule (40 mg total) by mouth daily. (Patient not taking: Reported on 07/09/2022) 90 capsule 3   sodium bicarbonate 650 MG tablet Take 650 mg by mouth 3 (three) times daily. (Patient not taking: Reported on 07/09/2022)     No current facility-administered medications for this visit.    Family History  Problem Relation Age of Onset   Hypertension Mother    Hypertension Father    Diabetes Father    Sarcoidosis Sister     Social History   Socioeconomic History   Marital status: Married    Spouse name: Not on file   Number of children: Not on file   Years of education: Not on file   Highest education level: Not on file  Occupational History   Occupation: Unemployed    Employer: UNEMPLOYED  Tobacco Use   Smoking status: Some Days    Packs/day: 0.50    Types: Cigarettes   Smokeless tobacco: Never  Vaping Use   Vaping Use: Never used  Substance and Sexual Activity   Alcohol use: Not Currently    Comment: rare   Drug use: Not Currently    Types: Marijuana   Sexual activity: Not on file  Other Topics Concern   Not on file  Social History Narrative   Domestic partner   Social Determinants of Health   Financial Resource Strain: Not on file  Food Insecurity: Not on file  Transportation Needs: Not on file  Physical Activity: Not on file  Stress: Not on file  Social Connections: Not on file  Intimate Partner Violence: Not on file     ROS: [x]  Positive   [ ]  Negative   [ ]  All sytems reviewed and are negative  Cardiac: []  chest pain/pressure []  SOB/DOE  Vascular: []  pain in legs while walking []  pain in feet when lying flat []  swelling in legs  Pulmonary: []  asthma []  wheezing  Neurologic: []  hx CVA/TIA  Hematologic: []  bleeding problems  GI []  GERD  GU: [x]  CKD/renal failure  [x]  HD---[]  M/W/F [x]  T/T/S  Psychiatric: []  hx of major depression  Integumentary: []  rashes []  ulcers  Constitutional: []  fever []  chills   PHYSICAL  EXAMINATION:  Today's Vitals   10/02/22 0907  BP: (!) 174/88  Pulse: 71  Resp: 20  Temp: 98.4 F (36.9 C)  TempSrc: Temporal  SpO2: 100%  Weight: 200 lb 14.4 oz (91.1 kg)  Height: 6\' 5"  (1.956 m)  PainSc: 8   PainLoc: Arm   Body mass index is 23.82 kg/m.    General:  WDWN male in  NAD Gait: Not observed HENT: WNL Pulmonary: normal non-labored breathing  Cardiac: regular, without carotid bruits Skin: without rashes Vascular Exam/Pulses:   Right Left  Radial 2+ (normal) 2+ (normal)   Extremities:  Right arm fistula with good thrill.  There is aneurysmal area.  There is no shiny skin or ulcerations present Musculoskeletal: no muscle wasting or atrophy  Neurologic: A&O X 3  Non-Invasive Vascular Imaging:   Dialysis duplex on 10/17/202: Findings:  +--------------------+----------+-----------------+--------+  AVF                 PSV (cm/s)Flow Vol (mL/min)Comments  +--------------------+----------+-----------------+--------+  Native artery inflow   454          1939                 +--------------------+----------+-----------------+--------+  AVF Anastomosis        431                               +--------------------+----------+-----------------+--------+  +------------+----------+-------------+----------+------------------------+  OUTFLOW VEINPSV (cm/s)Diameter (cm)Depth (cm)        Describe          +------------+----------+-------------+----------+------------------------+  Prox UA        305        0.59        0.24         small branch        +------------+----------+-------------+----------+------------------------+  Mid UA         136        0.91        0.27                             +------------+----------+-------------+----------+------------------------+  Dist UA         70        2.07        0.35   branch 0.513 cm  mid/dist  +------------+----------+-------------+----------+------------------------+  AC Fossa        402        0.82        0.37                             +------------+----------+-------------+----------+------------------------+   ASSESSMENT/PLAN: 46 y.o. male with ESRD here for evaluation of his hemodialysis access with hx of right BC AVF with hx of venoplasty in 2022 here for pain around fistula   -pt does not have evidence of steal in the right hand.  He does have pain/achiness around the fistula.  Discussed findings above with Dr. Scot Dock and will schedule for fistulogram given increased velocities proximal and decreased velocities distally.  I discussed with pt that we will evaluate fistula with possible intervention but this may or may not help the pain achiness around the fistula.  He expressed understanding.   -pt is on dialysis on T/T/S -discussed with pt that access does not last forever and will need intervention or even new access at some point.  -pt is right hand dominant -pt is not on anticoagulation   Leontine Locket, [facility] [phone]  Clinic MD:   Donzetta Matters

## 2022-10-02 ENCOUNTER — Other Ambulatory Visit: Payer: Self-pay

## 2022-10-02 ENCOUNTER — Ambulatory Visit (HOSPITAL_COMMUNITY)
Admission: RE | Admit: 2022-10-02 | Discharge: 2022-10-02 | Disposition: A | Discharge status: Home / Self Care | Payer: Medicare Other | Source: Ambulatory Visit | Attending: Vascular Surgery | Admitting: Vascular Surgery

## 2022-10-02 ENCOUNTER — Ambulatory Visit (INDEPENDENT_AMBULATORY_CARE_PROVIDER_SITE_OTHER): Payer: Medicare Other | Admitting: Physician Assistant

## 2022-10-02 VITALS — BP 174/88 | HR 71 | Temp 98.4°F | Resp 20 | Ht 77.0 in | Wt 200.9 lb

## 2022-10-02 DIAGNOSIS — Z992 Dependence on renal dialysis: Secondary | ICD-10-CM | POA: Insufficient documentation

## 2022-10-02 DIAGNOSIS — N186 End stage renal disease: Secondary | ICD-10-CM | POA: Diagnosis not present

## 2022-10-04 ENCOUNTER — Encounter (HOSPITAL_COMMUNITY)
Admission: RE | Disposition: A | Discharge status: Home / Self Care | Payer: Self-pay | Source: Ambulatory Visit | Attending: Vascular Surgery

## 2022-10-04 ENCOUNTER — Encounter (HOSPITAL_COMMUNITY): Payer: Self-pay | Admitting: Vascular Surgery

## 2022-10-04 ENCOUNTER — Other Ambulatory Visit: Payer: Self-pay

## 2022-10-04 ENCOUNTER — Ambulatory Visit (HOSPITAL_COMMUNITY)
Admission: RE | Admit: 2022-10-04 | Discharge: 2022-10-04 | Disposition: A | Discharge status: Home / Self Care | Payer: Medicare Other | Source: Ambulatory Visit | Attending: Vascular Surgery | Admitting: Vascular Surgery

## 2022-10-04 DIAGNOSIS — Y841 Kidney dialysis as the cause of abnormal reaction of the patient, or of later complication, without mention of misadventure at the time of the procedure: Secondary | ICD-10-CM | POA: Diagnosis not present

## 2022-10-04 DIAGNOSIS — T82898A Other specified complication of vascular prosthetic devices, implants and grafts, initial encounter: Secondary | ICD-10-CM | POA: Diagnosis not present

## 2022-10-04 DIAGNOSIS — I120 Hypertensive chronic kidney disease with stage 5 chronic kidney disease or end stage renal disease: Secondary | ICD-10-CM | POA: Insufficient documentation

## 2022-10-04 DIAGNOSIS — N186 End stage renal disease: Secondary | ICD-10-CM | POA: Diagnosis not present

## 2022-10-04 DIAGNOSIS — Z992 Dependence on renal dialysis: Secondary | ICD-10-CM | POA: Diagnosis not present

## 2022-10-04 DIAGNOSIS — F17210 Nicotine dependence, cigarettes, uncomplicated: Secondary | ICD-10-CM | POA: Insufficient documentation

## 2022-10-04 DIAGNOSIS — N185 Chronic kidney disease, stage 5: Secondary | ICD-10-CM | POA: Diagnosis not present

## 2022-10-04 HISTORY — PX: A/V FISTULAGRAM: CATH118298

## 2022-10-04 LAB — POCT I-STAT, CHEM 8
BUN: 64 mg/dL — ABNORMAL HIGH (ref 6–20)
Calcium, Ion: 1.2 mmol/L (ref 1.15–1.40)
Chloride: 106 mmol/L (ref 98–111)
Creatinine, Ser: 17.6 mg/dL — ABNORMAL HIGH (ref 0.61–1.24)
Glucose, Bld: 86 mg/dL (ref 70–99)
HCT: 33 % — ABNORMAL LOW (ref 39.0–52.0)
Hemoglobin: 11.2 g/dL — ABNORMAL LOW (ref 13.0–17.0)
Potassium: 5.9 mmol/L — ABNORMAL HIGH (ref 3.5–5.1)
Sodium: 138 mmol/L (ref 135–145)
TCO2: 24 mmol/L (ref 22–32)

## 2022-10-04 SURGERY — A/V FISTULAGRAM
Anesthesia: LOCAL | Laterality: Right

## 2022-10-04 MED ORDER — LIDOCAINE HCL (PF) 1 % IJ SOLN
INTRAMUSCULAR | Status: AC
  Filled 2022-10-04: qty 30

## 2022-10-04 MED ORDER — FENTANYL CITRATE (PF) 100 MCG/2ML IJ SOLN
INTRAMUSCULAR | Status: DC | PRN
  Administered 2022-10-04: 50 ug via INTRAVENOUS

## 2022-10-04 MED ORDER — MIDAZOLAM HCL 2 MG/2ML IJ SOLN
INTRAMUSCULAR | Status: DC | PRN
  Administered 2022-10-04: 1 mg via INTRAVENOUS

## 2022-10-04 MED ORDER — HEPARIN (PORCINE) IN NACL 1000-0.9 UT/500ML-% IV SOLN
INTRAVENOUS | Status: DC | PRN
  Administered 2022-10-04: 500 mL

## 2022-10-04 MED ORDER — SODIUM CHLORIDE 0.9 % IV SOLN
250.0000 mL | INTRAVENOUS | Status: DC | PRN
Start: 2022-10-04 — End: 2022-10-04

## 2022-10-04 MED ORDER — LIDOCAINE HCL (PF) 1 % IJ SOLN
INTRAMUSCULAR | Status: DC | PRN
  Administered 2022-10-04: 2 mL via INTRADERMAL

## 2022-10-04 MED ORDER — MIDAZOLAM HCL 2 MG/2ML IJ SOLN
INTRAMUSCULAR | Status: AC
  Filled 2022-10-04: qty 2

## 2022-10-04 MED ORDER — SODIUM CHLORIDE 0.9% FLUSH: 3.0000 mL | INTRAVENOUS | Status: DC | PRN

## 2022-10-04 MED ORDER — IODIXANOL 320 MG/ML IV SOLN
INTRAVENOUS | Status: DC | PRN
  Administered 2022-10-04: 30 mL via INTRAVENOUS

## 2022-10-04 MED ORDER — HEPARIN (PORCINE) IN NACL 1000-0.9 UT/500ML-% IV SOLN
INTRAVENOUS | Status: AC
  Filled 2022-10-04: qty 500

## 2022-10-04 MED ORDER — FENTANYL CITRATE (PF) 100 MCG/2ML IJ SOLN
INTRAMUSCULAR | Status: AC
  Filled 2022-10-04: qty 2

## 2022-10-04 MED ORDER — SODIUM CHLORIDE 0.9% FLUSH: 3.0000 mL | Freq: Two times a day (BID) | INTRAVENOUS | Status: DC

## 2022-10-04 SURGICAL SUPPLY — 9 items
BAG SNAP BAND KOVER 36X36 (MISCELLANEOUS) ×1 IMPLANT
COVER DOME SNAP 22 D (MISCELLANEOUS) ×1 IMPLANT
KIT MICROPUNCTURE NIT STIFF (SHEATH) IMPLANT
PROTECTION STATION PRESSURIZED (MISCELLANEOUS) ×1
SHEATH PROBE COVER 6X72 (BAG) ×1 IMPLANT
STATION PROTECTION PRESSURIZED (MISCELLANEOUS) ×1 IMPLANT
STOPCOCK MORSE 400PSI 3WAY (MISCELLANEOUS) ×1 IMPLANT
TRAY PV CATH (CUSTOM PROCEDURE TRAY) ×1 IMPLANT
TUBING CIL FLEX 10 FLL-RA (TUBING) ×1 IMPLANT

## 2022-10-04 NOTE — Progress Notes (Signed)
Patient was given discharge information. He verbalized understanding. 

## 2022-10-04 NOTE — Op Note (Signed)
   PATIENT: Isaiah Ramos      MRN: [id number] DOB: 09/24/1976    DATE OF PROCEDURE: 10/04/2022  INDICATIONS:    [name] is a 46 y.o. male who was having pain adjacent to his fistula along the lateral aspect of his arm.  He was set up for a fistulogram.  PROCEDURE:    Ultrasound-guided access to the right brachiocephalic fistula Fistulogram right brachiocephalic fistula  SURGEON: Judeth Cornfield. Scot Dock, MD, FACS  ANESTHESIA: Local with sedation  EBL: Minimal  TECHNIQUE: The patient was brought to the peripheral vascular lab and was sedated. The period of conscious sedation was 22 minutes.  During that time period, I was present face-to-face 100% of the time.  The patient was administered 1 mg of Versed and 50 mcg of fentanyl. The patient's heart rate, blood pressure, and oxygen saturation were monitored by the nurse continuously during the procedure.  I cannulated the proximal right brachiocephalic fistula under ultrasound guidance after the skin was anesthetized.  A micropuncture sheath was introduced over a wire.  A fistulogram was then obtained to evaluate the fistula from the point of cannulation to include the central veins.  I then compressed the fistula downstream from the cannulation site to provide a reflux shot to evaluate the arterial anastomosis.  At the completion of 4-0 Monocryl was placed around the cannulation site and there was good hemostasis.  FINDINGS:   No central venous stenosis Widely patent right brachiocephalic fistula No significant stenosis at the proximal anastomosis  [name], MD, FACS [facility]  DATE OF DICTATION:   10/04/2022

## 2022-10-04 NOTE — Interval H&P Note (Signed)
History and Physical Interval Note:  10/04/2022 11:52 AM  Isaiah Ramos  has presented today for surgery, with the diagnosis of end stage renal disease.  The various methods of treatment have been discussed with the patient and family. After consideration of risks, benefits and other options for treatment, the patient has consented to  Procedure(s): A/V Fistulagram (Right) as a surgical intervention.  The patient's history has been reviewed, patient examined, no change in status, stable for surgery.  I have reviewed the patient's chart and labs.  Questions were answered to the patient's satisfaction.     [name]

## 2022-10-05 DIAGNOSIS — N2581 Secondary hyperparathyroidism of renal origin: Secondary | ICD-10-CM | POA: Diagnosis not present

## 2022-10-05 DIAGNOSIS — N250 Renal osteodystrophy: Secondary | ICD-10-CM | POA: Diagnosis not present

## 2022-10-05 DIAGNOSIS — Z23 Encounter for immunization: Secondary | ICD-10-CM | POA: Diagnosis not present

## 2022-10-05 DIAGNOSIS — Z992 Dependence on renal dialysis: Secondary | ICD-10-CM | POA: Diagnosis not present

## 2022-10-05 DIAGNOSIS — N186 End stage renal disease: Secondary | ICD-10-CM | POA: Diagnosis not present

## 2022-10-05 DIAGNOSIS — E559 Vitamin D deficiency, unspecified: Secondary | ICD-10-CM | POA: Diagnosis not present

## 2022-10-08 DIAGNOSIS — N250 Renal osteodystrophy: Secondary | ICD-10-CM | POA: Diagnosis not present

## 2022-10-08 DIAGNOSIS — N2581 Secondary hyperparathyroidism of renal origin: Secondary | ICD-10-CM | POA: Diagnosis not present

## 2022-10-08 DIAGNOSIS — E559 Vitamin D deficiency, unspecified: Secondary | ICD-10-CM | POA: Diagnosis not present

## 2022-10-08 DIAGNOSIS — Z992 Dependence on renal dialysis: Secondary | ICD-10-CM | POA: Diagnosis not present

## 2022-10-08 DIAGNOSIS — N186 End stage renal disease: Secondary | ICD-10-CM | POA: Diagnosis not present

## 2022-10-08 DIAGNOSIS — Z23 Encounter for immunization: Secondary | ICD-10-CM | POA: Diagnosis not present

## 2022-10-10 DIAGNOSIS — N250 Renal osteodystrophy: Secondary | ICD-10-CM | POA: Diagnosis not present

## 2022-10-10 DIAGNOSIS — E8779 Other fluid overload: Secondary | ICD-10-CM | POA: Diagnosis not present

## 2022-10-10 DIAGNOSIS — N2581 Secondary hyperparathyroidism of renal origin: Secondary | ICD-10-CM | POA: Diagnosis not present

## 2022-10-10 DIAGNOSIS — N186 End stage renal disease: Secondary | ICD-10-CM | POA: Diagnosis not present

## 2022-10-10 DIAGNOSIS — Z992 Dependence on renal dialysis: Secondary | ICD-10-CM | POA: Diagnosis not present

## 2022-10-10 DIAGNOSIS — E559 Vitamin D deficiency, unspecified: Secondary | ICD-10-CM | POA: Diagnosis not present

## 2022-10-14 DIAGNOSIS — N185 Chronic kidney disease, stage 5: Secondary | ICD-10-CM | POA: Diagnosis not present

## 2022-10-14 DIAGNOSIS — K219 Gastro-esophageal reflux disease without esophagitis: Secondary | ICD-10-CM | POA: Diagnosis not present

## 2022-10-14 DIAGNOSIS — E213 Hyperparathyroidism, unspecified: Secondary | ICD-10-CM | POA: Diagnosis not present

## 2022-10-14 DIAGNOSIS — L309 Dermatitis, unspecified: Secondary | ICD-10-CM | POA: Diagnosis not present

## 2022-10-15 DIAGNOSIS — Z992 Dependence on renal dialysis: Secondary | ICD-10-CM | POA: Diagnosis not present

## 2022-10-15 DIAGNOSIS — N2581 Secondary hyperparathyroidism of renal origin: Secondary | ICD-10-CM | POA: Diagnosis not present

## 2022-10-15 DIAGNOSIS — N186 End stage renal disease: Secondary | ICD-10-CM | POA: Diagnosis not present

## 2022-10-15 DIAGNOSIS — E559 Vitamin D deficiency, unspecified: Secondary | ICD-10-CM | POA: Diagnosis not present

## 2022-10-15 DIAGNOSIS — E8779 Other fluid overload: Secondary | ICD-10-CM | POA: Diagnosis not present

## 2022-10-15 DIAGNOSIS — N250 Renal osteodystrophy: Secondary | ICD-10-CM | POA: Diagnosis not present

## 2022-10-19 DIAGNOSIS — E8779 Other fluid overload: Secondary | ICD-10-CM | POA: Diagnosis not present

## 2022-10-19 DIAGNOSIS — N250 Renal osteodystrophy: Secondary | ICD-10-CM | POA: Diagnosis not present

## 2022-10-19 DIAGNOSIS — Z992 Dependence on renal dialysis: Secondary | ICD-10-CM | POA: Diagnosis not present

## 2022-10-19 DIAGNOSIS — N186 End stage renal disease: Secondary | ICD-10-CM | POA: Diagnosis not present

## 2022-10-19 DIAGNOSIS — N2581 Secondary hyperparathyroidism of renal origin: Secondary | ICD-10-CM | POA: Diagnosis not present

## 2022-10-19 DIAGNOSIS — E559 Vitamin D deficiency, unspecified: Secondary | ICD-10-CM | POA: Diagnosis not present

## 2022-10-22 DIAGNOSIS — N250 Renal osteodystrophy: Secondary | ICD-10-CM | POA: Diagnosis not present

## 2022-10-22 DIAGNOSIS — N186 End stage renal disease: Secondary | ICD-10-CM | POA: Diagnosis not present

## 2022-10-22 DIAGNOSIS — N2581 Secondary hyperparathyroidism of renal origin: Secondary | ICD-10-CM | POA: Diagnosis not present

## 2022-10-22 DIAGNOSIS — E8779 Other fluid overload: Secondary | ICD-10-CM | POA: Diagnosis not present

## 2022-10-22 DIAGNOSIS — Z992 Dependence on renal dialysis: Secondary | ICD-10-CM | POA: Diagnosis not present

## 2022-10-22 DIAGNOSIS — E559 Vitamin D deficiency, unspecified: Secondary | ICD-10-CM | POA: Diagnosis not present

## 2022-10-24 DIAGNOSIS — N186 End stage renal disease: Secondary | ICD-10-CM | POA: Diagnosis not present

## 2022-10-24 DIAGNOSIS — E8779 Other fluid overload: Secondary | ICD-10-CM | POA: Diagnosis not present

## 2022-10-24 DIAGNOSIS — Z992 Dependence on renal dialysis: Secondary | ICD-10-CM | POA: Diagnosis not present

## 2022-10-24 DIAGNOSIS — N2581 Secondary hyperparathyroidism of renal origin: Secondary | ICD-10-CM | POA: Diagnosis not present

## 2022-10-24 DIAGNOSIS — E559 Vitamin D deficiency, unspecified: Secondary | ICD-10-CM | POA: Diagnosis not present

## 2022-10-24 DIAGNOSIS — N250 Renal osteodystrophy: Secondary | ICD-10-CM | POA: Diagnosis not present

## 2022-10-25 DIAGNOSIS — E8779 Other fluid overload: Secondary | ICD-10-CM | POA: Diagnosis not present

## 2022-10-25 DIAGNOSIS — N250 Renal osteodystrophy: Secondary | ICD-10-CM | POA: Diagnosis not present

## 2022-10-25 DIAGNOSIS — E559 Vitamin D deficiency, unspecified: Secondary | ICD-10-CM | POA: Diagnosis not present

## 2022-10-25 DIAGNOSIS — Z992 Dependence on renal dialysis: Secondary | ICD-10-CM | POA: Diagnosis not present

## 2022-10-25 DIAGNOSIS — N186 End stage renal disease: Secondary | ICD-10-CM | POA: Diagnosis not present

## 2022-10-25 DIAGNOSIS — N2581 Secondary hyperparathyroidism of renal origin: Secondary | ICD-10-CM | POA: Diagnosis not present

## 2022-10-26 DIAGNOSIS — N250 Renal osteodystrophy: Secondary | ICD-10-CM | POA: Diagnosis not present

## 2022-10-26 DIAGNOSIS — N2581 Secondary hyperparathyroidism of renal origin: Secondary | ICD-10-CM | POA: Diagnosis not present

## 2022-10-26 DIAGNOSIS — N186 End stage renal disease: Secondary | ICD-10-CM | POA: Diagnosis not present

## 2022-10-26 DIAGNOSIS — E559 Vitamin D deficiency, unspecified: Secondary | ICD-10-CM | POA: Diagnosis not present

## 2022-10-26 DIAGNOSIS — Z992 Dependence on renal dialysis: Secondary | ICD-10-CM | POA: Diagnosis not present

## 2022-10-26 DIAGNOSIS — E8779 Other fluid overload: Secondary | ICD-10-CM | POA: Diagnosis not present

## 2022-10-29 DIAGNOSIS — N250 Renal osteodystrophy: Secondary | ICD-10-CM | POA: Diagnosis not present

## 2022-10-29 DIAGNOSIS — Z992 Dependence on renal dialysis: Secondary | ICD-10-CM | POA: Diagnosis not present

## 2022-10-29 DIAGNOSIS — E559 Vitamin D deficiency, unspecified: Secondary | ICD-10-CM | POA: Diagnosis not present

## 2022-10-29 DIAGNOSIS — E8779 Other fluid overload: Secondary | ICD-10-CM | POA: Diagnosis not present

## 2022-10-29 DIAGNOSIS — N186 End stage renal disease: Secondary | ICD-10-CM | POA: Diagnosis not present

## 2022-10-29 DIAGNOSIS — N2581 Secondary hyperparathyroidism of renal origin: Secondary | ICD-10-CM | POA: Diagnosis not present

## 2022-11-01 DIAGNOSIS — E559 Vitamin D deficiency, unspecified: Secondary | ICD-10-CM | POA: Diagnosis not present

## 2022-11-01 DIAGNOSIS — E8779 Other fluid overload: Secondary | ICD-10-CM | POA: Diagnosis not present

## 2022-11-01 DIAGNOSIS — N186 End stage renal disease: Secondary | ICD-10-CM | POA: Diagnosis not present

## 2022-11-01 DIAGNOSIS — N250 Renal osteodystrophy: Secondary | ICD-10-CM | POA: Diagnosis not present

## 2022-11-01 DIAGNOSIS — Z992 Dependence on renal dialysis: Secondary | ICD-10-CM | POA: Diagnosis not present

## 2022-11-01 DIAGNOSIS — N2581 Secondary hyperparathyroidism of renal origin: Secondary | ICD-10-CM | POA: Diagnosis not present

## 2022-11-02 DIAGNOSIS — N186 End stage renal disease: Secondary | ICD-10-CM | POA: Diagnosis not present

## 2022-11-02 DIAGNOSIS — E8779 Other fluid overload: Secondary | ICD-10-CM | POA: Diagnosis not present

## 2022-11-02 DIAGNOSIS — Z992 Dependence on renal dialysis: Secondary | ICD-10-CM | POA: Diagnosis not present

## 2022-11-02 DIAGNOSIS — E559 Vitamin D deficiency, unspecified: Secondary | ICD-10-CM | POA: Diagnosis not present

## 2022-11-02 DIAGNOSIS — N2581 Secondary hyperparathyroidism of renal origin: Secondary | ICD-10-CM | POA: Diagnosis not present

## 2022-11-02 DIAGNOSIS — N250 Renal osteodystrophy: Secondary | ICD-10-CM | POA: Diagnosis not present

## 2022-11-05 DIAGNOSIS — N2581 Secondary hyperparathyroidism of renal origin: Secondary | ICD-10-CM | POA: Diagnosis not present

## 2022-11-05 DIAGNOSIS — E559 Vitamin D deficiency, unspecified: Secondary | ICD-10-CM | POA: Diagnosis not present

## 2022-11-05 DIAGNOSIS — Z992 Dependence on renal dialysis: Secondary | ICD-10-CM | POA: Diagnosis not present

## 2022-11-05 DIAGNOSIS — E8779 Other fluid overload: Secondary | ICD-10-CM | POA: Diagnosis not present

## 2022-11-05 DIAGNOSIS — N186 End stage renal disease: Secondary | ICD-10-CM | POA: Diagnosis not present

## 2022-11-05 DIAGNOSIS — N250 Renal osteodystrophy: Secondary | ICD-10-CM | POA: Diagnosis not present

## 2022-11-07 DIAGNOSIS — Z992 Dependence on renal dialysis: Secondary | ICD-10-CM | POA: Diagnosis not present

## 2022-11-07 DIAGNOSIS — E559 Vitamin D deficiency, unspecified: Secondary | ICD-10-CM | POA: Diagnosis not present

## 2022-11-07 DIAGNOSIS — N250 Renal osteodystrophy: Secondary | ICD-10-CM | POA: Diagnosis not present

## 2022-11-07 DIAGNOSIS — E8779 Other fluid overload: Secondary | ICD-10-CM | POA: Diagnosis not present

## 2022-11-07 DIAGNOSIS — N186 End stage renal disease: Secondary | ICD-10-CM | POA: Diagnosis not present

## 2022-11-07 DIAGNOSIS — N2581 Secondary hyperparathyroidism of renal origin: Secondary | ICD-10-CM | POA: Diagnosis not present

## 2022-11-09 DIAGNOSIS — N186 End stage renal disease: Secondary | ICD-10-CM | POA: Diagnosis not present

## 2022-11-09 DIAGNOSIS — Z992 Dependence on renal dialysis: Secondary | ICD-10-CM | POA: Diagnosis not present

## 2022-11-09 DIAGNOSIS — N2581 Secondary hyperparathyroidism of renal origin: Secondary | ICD-10-CM | POA: Diagnosis not present

## 2022-11-12 DIAGNOSIS — Z992 Dependence on renal dialysis: Secondary | ICD-10-CM | POA: Diagnosis not present

## 2022-11-12 DIAGNOSIS — N2581 Secondary hyperparathyroidism of renal origin: Secondary | ICD-10-CM | POA: Diagnosis not present

## 2022-11-12 DIAGNOSIS — N186 End stage renal disease: Secondary | ICD-10-CM | POA: Diagnosis not present

## 2022-11-14 DIAGNOSIS — N2581 Secondary hyperparathyroidism of renal origin: Secondary | ICD-10-CM | POA: Diagnosis not present

## 2022-11-14 DIAGNOSIS — N186 End stage renal disease: Secondary | ICD-10-CM | POA: Diagnosis not present

## 2022-11-14 DIAGNOSIS — Z992 Dependence on renal dialysis: Secondary | ICD-10-CM | POA: Diagnosis not present

## 2022-11-16 DIAGNOSIS — Z992 Dependence on renal dialysis: Secondary | ICD-10-CM | POA: Diagnosis not present

## 2022-11-16 DIAGNOSIS — N186 End stage renal disease: Secondary | ICD-10-CM | POA: Diagnosis not present

## 2022-11-16 DIAGNOSIS — N2581 Secondary hyperparathyroidism of renal origin: Secondary | ICD-10-CM | POA: Diagnosis not present

## 2022-11-19 DIAGNOSIS — N186 End stage renal disease: Secondary | ICD-10-CM | POA: Diagnosis not present

## 2022-11-19 DIAGNOSIS — Z992 Dependence on renal dialysis: Secondary | ICD-10-CM | POA: Diagnosis not present

## 2022-11-19 DIAGNOSIS — N2581 Secondary hyperparathyroidism of renal origin: Secondary | ICD-10-CM | POA: Diagnosis not present

## 2022-11-21 DIAGNOSIS — Z992 Dependence on renal dialysis: Secondary | ICD-10-CM | POA: Diagnosis not present

## 2022-11-21 DIAGNOSIS — N2581 Secondary hyperparathyroidism of renal origin: Secondary | ICD-10-CM | POA: Diagnosis not present

## 2022-11-21 DIAGNOSIS — N186 End stage renal disease: Secondary | ICD-10-CM | POA: Diagnosis not present

## 2022-11-23 DIAGNOSIS — N2581 Secondary hyperparathyroidism of renal origin: Secondary | ICD-10-CM | POA: Diagnosis not present

## 2022-11-23 DIAGNOSIS — N186 End stage renal disease: Secondary | ICD-10-CM | POA: Diagnosis not present

## 2022-11-23 DIAGNOSIS — Z992 Dependence on renal dialysis: Secondary | ICD-10-CM | POA: Diagnosis not present

## 2022-11-26 DIAGNOSIS — N186 End stage renal disease: Secondary | ICD-10-CM | POA: Diagnosis not present

## 2022-11-26 DIAGNOSIS — N2581 Secondary hyperparathyroidism of renal origin: Secondary | ICD-10-CM | POA: Diagnosis not present

## 2022-11-26 DIAGNOSIS — Z992 Dependence on renal dialysis: Secondary | ICD-10-CM | POA: Diagnosis not present

## 2022-11-28 DIAGNOSIS — N2581 Secondary hyperparathyroidism of renal origin: Secondary | ICD-10-CM | POA: Diagnosis not present

## 2022-11-28 DIAGNOSIS — N186 End stage renal disease: Secondary | ICD-10-CM | POA: Diagnosis not present

## 2022-11-28 DIAGNOSIS — Z992 Dependence on renal dialysis: Secondary | ICD-10-CM | POA: Diagnosis not present

## 2022-11-30 DIAGNOSIS — N2581 Secondary hyperparathyroidism of renal origin: Secondary | ICD-10-CM | POA: Diagnosis not present

## 2022-11-30 DIAGNOSIS — Z992 Dependence on renal dialysis: Secondary | ICD-10-CM | POA: Diagnosis not present

## 2022-11-30 DIAGNOSIS — N186 End stage renal disease: Secondary | ICD-10-CM | POA: Diagnosis not present

## 2022-12-05 DIAGNOSIS — Z992 Dependence on renal dialysis: Secondary | ICD-10-CM | POA: Diagnosis not present

## 2022-12-05 DIAGNOSIS — N186 End stage renal disease: Secondary | ICD-10-CM | POA: Diagnosis not present

## 2022-12-05 DIAGNOSIS — N2581 Secondary hyperparathyroidism of renal origin: Secondary | ICD-10-CM | POA: Diagnosis not present

## 2022-12-07 DIAGNOSIS — N2581 Secondary hyperparathyroidism of renal origin: Secondary | ICD-10-CM | POA: Diagnosis not present

## 2022-12-07 DIAGNOSIS — Z992 Dependence on renal dialysis: Secondary | ICD-10-CM | POA: Diagnosis not present

## 2022-12-07 DIAGNOSIS — N186 End stage renal disease: Secondary | ICD-10-CM | POA: Diagnosis not present

## 2022-12-08 DIAGNOSIS — N186 End stage renal disease: Secondary | ICD-10-CM | POA: Diagnosis not present

## 2022-12-08 DIAGNOSIS — Z992 Dependence on renal dialysis: Secondary | ICD-10-CM | POA: Diagnosis not present

## 2022-12-10 DIAGNOSIS — N186 End stage renal disease: Secondary | ICD-10-CM | POA: Diagnosis not present

## 2022-12-10 DIAGNOSIS — Z992 Dependence on renal dialysis: Secondary | ICD-10-CM | POA: Diagnosis not present

## 2022-12-10 DIAGNOSIS — N2581 Secondary hyperparathyroidism of renal origin: Secondary | ICD-10-CM | POA: Diagnosis not present

## 2022-12-12 DIAGNOSIS — N186 End stage renal disease: Secondary | ICD-10-CM | POA: Diagnosis not present

## 2022-12-12 DIAGNOSIS — N2581 Secondary hyperparathyroidism of renal origin: Secondary | ICD-10-CM | POA: Diagnosis not present

## 2022-12-12 DIAGNOSIS — Z992 Dependence on renal dialysis: Secondary | ICD-10-CM | POA: Diagnosis not present

## 2022-12-13 DIAGNOSIS — N186 End stage renal disease: Secondary | ICD-10-CM | POA: Diagnosis not present

## 2022-12-13 DIAGNOSIS — N2581 Secondary hyperparathyroidism of renal origin: Secondary | ICD-10-CM | POA: Diagnosis not present

## 2022-12-13 DIAGNOSIS — Z992 Dependence on renal dialysis: Secondary | ICD-10-CM | POA: Diagnosis not present

## 2022-12-19 DIAGNOSIS — N186 End stage renal disease: Secondary | ICD-10-CM | POA: Diagnosis not present

## 2022-12-19 DIAGNOSIS — Z992 Dependence on renal dialysis: Secondary | ICD-10-CM | POA: Diagnosis not present

## 2022-12-19 DIAGNOSIS — N2581 Secondary hyperparathyroidism of renal origin: Secondary | ICD-10-CM | POA: Diagnosis not present

## 2022-12-20 DIAGNOSIS — N2581 Secondary hyperparathyroidism of renal origin: Secondary | ICD-10-CM | POA: Diagnosis not present

## 2022-12-20 DIAGNOSIS — N186 End stage renal disease: Secondary | ICD-10-CM | POA: Diagnosis not present

## 2022-12-20 DIAGNOSIS — Z992 Dependence on renal dialysis: Secondary | ICD-10-CM | POA: Diagnosis not present

## 2022-12-24 DIAGNOSIS — N2581 Secondary hyperparathyroidism of renal origin: Secondary | ICD-10-CM | POA: Diagnosis not present

## 2022-12-24 DIAGNOSIS — N186 End stage renal disease: Secondary | ICD-10-CM | POA: Diagnosis not present

## 2022-12-24 DIAGNOSIS — Z992 Dependence on renal dialysis: Secondary | ICD-10-CM | POA: Diagnosis not present

## 2022-12-25 DIAGNOSIS — Z992 Dependence on renal dialysis: Secondary | ICD-10-CM | POA: Diagnosis not present

## 2022-12-25 DIAGNOSIS — N2581 Secondary hyperparathyroidism of renal origin: Secondary | ICD-10-CM | POA: Diagnosis not present

## 2022-12-25 DIAGNOSIS — N186 End stage renal disease: Secondary | ICD-10-CM | POA: Diagnosis not present

## 2022-12-29 DIAGNOSIS — J209 Acute bronchitis, unspecified: Secondary | ICD-10-CM | POA: Diagnosis not present

## 2022-12-29 DIAGNOSIS — Z1152 Encounter for screening for COVID-19: Secondary | ICD-10-CM | POA: Diagnosis not present

## 2022-12-29 DIAGNOSIS — N186 End stage renal disease: Secondary | ICD-10-CM | POA: Diagnosis not present

## 2022-12-29 DIAGNOSIS — F17210 Nicotine dependence, cigarettes, uncomplicated: Secondary | ICD-10-CM | POA: Diagnosis not present

## 2022-12-29 DIAGNOSIS — I120 Hypertensive chronic kidney disease with stage 5 chronic kidney disease or end stage renal disease: Secondary | ICD-10-CM | POA: Diagnosis not present

## 2022-12-29 DIAGNOSIS — Z992 Dependence on renal dialysis: Secondary | ICD-10-CM | POA: Diagnosis not present

## 2022-12-30 DIAGNOSIS — Z992 Dependence on renal dialysis: Secondary | ICD-10-CM | POA: Diagnosis not present

## 2022-12-30 DIAGNOSIS — N186 End stage renal disease: Secondary | ICD-10-CM | POA: Diagnosis not present

## 2022-12-30 DIAGNOSIS — N2581 Secondary hyperparathyroidism of renal origin: Secondary | ICD-10-CM | POA: Diagnosis not present

## 2023-01-01 DIAGNOSIS — Z992 Dependence on renal dialysis: Secondary | ICD-10-CM | POA: Diagnosis not present

## 2023-01-01 DIAGNOSIS — N186 End stage renal disease: Secondary | ICD-10-CM | POA: Diagnosis not present

## 2023-01-01 DIAGNOSIS — N2581 Secondary hyperparathyroidism of renal origin: Secondary | ICD-10-CM | POA: Diagnosis not present

## 2023-01-03 DIAGNOSIS — N186 End stage renal disease: Secondary | ICD-10-CM | POA: Diagnosis not present

## 2023-01-03 DIAGNOSIS — N2581 Secondary hyperparathyroidism of renal origin: Secondary | ICD-10-CM | POA: Diagnosis not present

## 2023-01-03 DIAGNOSIS — Z992 Dependence on renal dialysis: Secondary | ICD-10-CM | POA: Diagnosis not present

## 2023-01-06 DIAGNOSIS — N2581 Secondary hyperparathyroidism of renal origin: Secondary | ICD-10-CM | POA: Diagnosis not present

## 2023-01-06 DIAGNOSIS — Z992 Dependence on renal dialysis: Secondary | ICD-10-CM | POA: Diagnosis not present

## 2023-01-06 DIAGNOSIS — N186 End stage renal disease: Secondary | ICD-10-CM | POA: Diagnosis not present

## 2023-01-08 DIAGNOSIS — N2581 Secondary hyperparathyroidism of renal origin: Secondary | ICD-10-CM | POA: Diagnosis not present

## 2023-01-08 DIAGNOSIS — N186 End stage renal disease: Secondary | ICD-10-CM | POA: Diagnosis not present

## 2023-01-08 DIAGNOSIS — Z992 Dependence on renal dialysis: Secondary | ICD-10-CM | POA: Diagnosis not present

## 2023-01-10 DIAGNOSIS — N186 End stage renal disease: Secondary | ICD-10-CM | POA: Diagnosis not present

## 2023-01-10 DIAGNOSIS — Z992 Dependence on renal dialysis: Secondary | ICD-10-CM | POA: Diagnosis not present

## 2023-01-10 DIAGNOSIS — N2581 Secondary hyperparathyroidism of renal origin: Secondary | ICD-10-CM | POA: Diagnosis not present

## 2023-01-13 DIAGNOSIS — N186 End stage renal disease: Secondary | ICD-10-CM | POA: Diagnosis not present

## 2023-01-13 DIAGNOSIS — Z992 Dependence on renal dialysis: Secondary | ICD-10-CM | POA: Diagnosis not present

## 2023-01-13 DIAGNOSIS — N2581 Secondary hyperparathyroidism of renal origin: Secondary | ICD-10-CM | POA: Diagnosis not present

## 2023-01-15 DIAGNOSIS — Z992 Dependence on renal dialysis: Secondary | ICD-10-CM | POA: Diagnosis not present

## 2023-01-15 DIAGNOSIS — N2581 Secondary hyperparathyroidism of renal origin: Secondary | ICD-10-CM | POA: Diagnosis not present

## 2023-01-15 DIAGNOSIS — N186 End stage renal disease: Secondary | ICD-10-CM | POA: Diagnosis not present

## 2023-01-16 DIAGNOSIS — Z131 Encounter for screening for diabetes mellitus: Secondary | ICD-10-CM | POA: Diagnosis not present

## 2023-01-16 DIAGNOSIS — Z0000 Encounter for general adult medical examination without abnormal findings: Secondary | ICD-10-CM | POA: Diagnosis not present

## 2023-01-16 DIAGNOSIS — N186 End stage renal disease: Secondary | ICD-10-CM | POA: Diagnosis not present

## 2023-01-16 DIAGNOSIS — J069 Acute upper respiratory infection, unspecified: Secondary | ICD-10-CM | POA: Diagnosis not present

## 2023-01-16 DIAGNOSIS — E213 Hyperparathyroidism, unspecified: Secondary | ICD-10-CM | POA: Diagnosis not present

## 2023-01-16 DIAGNOSIS — N185 Chronic kidney disease, stage 5: Secondary | ICD-10-CM | POA: Diagnosis not present

## 2023-01-16 DIAGNOSIS — I10 Essential (primary) hypertension: Secondary | ICD-10-CM | POA: Diagnosis not present

## 2023-01-16 DIAGNOSIS — K219 Gastro-esophageal reflux disease without esophagitis: Secondary | ICD-10-CM | POA: Diagnosis not present

## 2023-01-17 DIAGNOSIS — N2581 Secondary hyperparathyroidism of renal origin: Secondary | ICD-10-CM | POA: Diagnosis not present

## 2023-01-17 DIAGNOSIS — N186 End stage renal disease: Secondary | ICD-10-CM | POA: Diagnosis not present

## 2023-01-17 DIAGNOSIS — Z992 Dependence on renal dialysis: Secondary | ICD-10-CM | POA: Diagnosis not present

## 2023-01-20 DIAGNOSIS — Z992 Dependence on renal dialysis: Secondary | ICD-10-CM | POA: Diagnosis not present

## 2023-01-20 DIAGNOSIS — N2581 Secondary hyperparathyroidism of renal origin: Secondary | ICD-10-CM | POA: Diagnosis not present

## 2023-01-20 DIAGNOSIS — N186 End stage renal disease: Secondary | ICD-10-CM | POA: Diagnosis not present

## 2023-01-22 DIAGNOSIS — Z992 Dependence on renal dialysis: Secondary | ICD-10-CM | POA: Diagnosis not present

## 2023-01-22 DIAGNOSIS — N186 End stage renal disease: Secondary | ICD-10-CM | POA: Diagnosis not present

## 2023-01-22 DIAGNOSIS — N2581 Secondary hyperparathyroidism of renal origin: Secondary | ICD-10-CM | POA: Diagnosis not present

## 2023-01-24 DIAGNOSIS — N186 End stage renal disease: Secondary | ICD-10-CM | POA: Diagnosis not present

## 2023-01-24 DIAGNOSIS — N2581 Secondary hyperparathyroidism of renal origin: Secondary | ICD-10-CM | POA: Diagnosis not present

## 2023-01-24 DIAGNOSIS — Z992 Dependence on renal dialysis: Secondary | ICD-10-CM | POA: Diagnosis not present

## 2023-01-27 DIAGNOSIS — N2581 Secondary hyperparathyroidism of renal origin: Secondary | ICD-10-CM | POA: Diagnosis not present

## 2023-01-27 DIAGNOSIS — Z992 Dependence on renal dialysis: Secondary | ICD-10-CM | POA: Diagnosis not present

## 2023-01-27 DIAGNOSIS — N186 End stage renal disease: Secondary | ICD-10-CM | POA: Diagnosis not present

## 2023-01-29 DIAGNOSIS — N186 End stage renal disease: Secondary | ICD-10-CM | POA: Diagnosis not present

## 2023-01-29 DIAGNOSIS — Z992 Dependence on renal dialysis: Secondary | ICD-10-CM | POA: Diagnosis not present

## 2023-01-29 DIAGNOSIS — N2581 Secondary hyperparathyroidism of renal origin: Secondary | ICD-10-CM | POA: Diagnosis not present

## 2023-01-31 DIAGNOSIS — Z992 Dependence on renal dialysis: Secondary | ICD-10-CM | POA: Diagnosis not present

## 2023-01-31 DIAGNOSIS — N186 End stage renal disease: Secondary | ICD-10-CM | POA: Diagnosis not present

## 2023-01-31 DIAGNOSIS — N2581 Secondary hyperparathyroidism of renal origin: Secondary | ICD-10-CM | POA: Diagnosis not present

## 2023-02-03 DIAGNOSIS — Z992 Dependence on renal dialysis: Secondary | ICD-10-CM | POA: Diagnosis not present

## 2023-02-03 DIAGNOSIS — N2581 Secondary hyperparathyroidism of renal origin: Secondary | ICD-10-CM | POA: Diagnosis not present

## 2023-02-03 DIAGNOSIS — N186 End stage renal disease: Secondary | ICD-10-CM | POA: Diagnosis not present

## 2023-02-05 DIAGNOSIS — N2581 Secondary hyperparathyroidism of renal origin: Secondary | ICD-10-CM | POA: Diagnosis not present

## 2023-02-05 DIAGNOSIS — N186 End stage renal disease: Secondary | ICD-10-CM | POA: Diagnosis not present

## 2023-02-05 DIAGNOSIS — Z992 Dependence on renal dialysis: Secondary | ICD-10-CM | POA: Diagnosis not present

## 2023-02-06 DIAGNOSIS — Z992 Dependence on renal dialysis: Secondary | ICD-10-CM | POA: Diagnosis not present

## 2023-02-06 DIAGNOSIS — N186 End stage renal disease: Secondary | ICD-10-CM | POA: Diagnosis not present

## 2023-02-07 DIAGNOSIS — Z992 Dependence on renal dialysis: Secondary | ICD-10-CM | POA: Diagnosis not present

## 2023-02-07 DIAGNOSIS — N2581 Secondary hyperparathyroidism of renal origin: Secondary | ICD-10-CM | POA: Diagnosis not present

## 2023-02-07 DIAGNOSIS — N186 End stage renal disease: Secondary | ICD-10-CM | POA: Diagnosis not present

## 2023-02-10 DIAGNOSIS — N186 End stage renal disease: Secondary | ICD-10-CM | POA: Diagnosis not present

## 2023-02-10 DIAGNOSIS — N2581 Secondary hyperparathyroidism of renal origin: Secondary | ICD-10-CM | POA: Diagnosis not present

## 2023-02-10 DIAGNOSIS — Z992 Dependence on renal dialysis: Secondary | ICD-10-CM | POA: Diagnosis not present

## 2023-02-12 DIAGNOSIS — N186 End stage renal disease: Secondary | ICD-10-CM | POA: Diagnosis not present

## 2023-02-12 DIAGNOSIS — Z992 Dependence on renal dialysis: Secondary | ICD-10-CM | POA: Diagnosis not present

## 2023-02-12 DIAGNOSIS — N2581 Secondary hyperparathyroidism of renal origin: Secondary | ICD-10-CM | POA: Diagnosis not present

## 2023-02-14 DIAGNOSIS — N186 End stage renal disease: Secondary | ICD-10-CM | POA: Diagnosis not present

## 2023-02-14 DIAGNOSIS — N2581 Secondary hyperparathyroidism of renal origin: Secondary | ICD-10-CM | POA: Diagnosis not present

## 2023-02-14 DIAGNOSIS — Z992 Dependence on renal dialysis: Secondary | ICD-10-CM | POA: Diagnosis not present

## 2023-02-17 DIAGNOSIS — N2581 Secondary hyperparathyroidism of renal origin: Secondary | ICD-10-CM | POA: Diagnosis not present

## 2023-02-17 DIAGNOSIS — N186 End stage renal disease: Secondary | ICD-10-CM | POA: Diagnosis not present

## 2023-02-17 DIAGNOSIS — Z992 Dependence on renal dialysis: Secondary | ICD-10-CM | POA: Diagnosis not present

## 2023-02-19 DIAGNOSIS — N2581 Secondary hyperparathyroidism of renal origin: Secondary | ICD-10-CM | POA: Diagnosis not present

## 2023-02-19 DIAGNOSIS — N186 End stage renal disease: Secondary | ICD-10-CM | POA: Diagnosis not present

## 2023-02-19 DIAGNOSIS — Z992 Dependence on renal dialysis: Secondary | ICD-10-CM | POA: Diagnosis not present

## 2023-02-21 DIAGNOSIS — N2581 Secondary hyperparathyroidism of renal origin: Secondary | ICD-10-CM | POA: Diagnosis not present

## 2023-02-21 DIAGNOSIS — N186 End stage renal disease: Secondary | ICD-10-CM | POA: Diagnosis not present

## 2023-02-21 DIAGNOSIS — Z992 Dependence on renal dialysis: Secondary | ICD-10-CM | POA: Diagnosis not present

## 2023-02-24 DIAGNOSIS — Z992 Dependence on renal dialysis: Secondary | ICD-10-CM | POA: Diagnosis not present

## 2023-02-24 DIAGNOSIS — N2581 Secondary hyperparathyroidism of renal origin: Secondary | ICD-10-CM | POA: Diagnosis not present

## 2023-02-24 DIAGNOSIS — N186 End stage renal disease: Secondary | ICD-10-CM | POA: Diagnosis not present

## 2023-02-26 DIAGNOSIS — N2581 Secondary hyperparathyroidism of renal origin: Secondary | ICD-10-CM | POA: Diagnosis not present

## 2023-02-26 DIAGNOSIS — N186 End stage renal disease: Secondary | ICD-10-CM | POA: Diagnosis not present

## 2023-02-26 DIAGNOSIS — Z992 Dependence on renal dialysis: Secondary | ICD-10-CM | POA: Diagnosis not present

## 2023-02-28 DIAGNOSIS — Z992 Dependence on renal dialysis: Secondary | ICD-10-CM | POA: Diagnosis not present

## 2023-02-28 DIAGNOSIS — N186 End stage renal disease: Secondary | ICD-10-CM | POA: Diagnosis not present

## 2023-02-28 DIAGNOSIS — N2581 Secondary hyperparathyroidism of renal origin: Secondary | ICD-10-CM | POA: Diagnosis not present

## 2023-03-03 DIAGNOSIS — N186 End stage renal disease: Secondary | ICD-10-CM | POA: Diagnosis not present

## 2023-03-03 DIAGNOSIS — N2581 Secondary hyperparathyroidism of renal origin: Secondary | ICD-10-CM | POA: Diagnosis not present

## 2023-03-03 DIAGNOSIS — Z992 Dependence on renal dialysis: Secondary | ICD-10-CM | POA: Diagnosis not present

## 2023-03-06 DIAGNOSIS — N2581 Secondary hyperparathyroidism of renal origin: Secondary | ICD-10-CM | POA: Diagnosis not present

## 2023-03-06 DIAGNOSIS — Z992 Dependence on renal dialysis: Secondary | ICD-10-CM | POA: Diagnosis not present

## 2023-03-06 DIAGNOSIS — N186 End stage renal disease: Secondary | ICD-10-CM | POA: Diagnosis not present

## 2023-03-09 DIAGNOSIS — N186 End stage renal disease: Secondary | ICD-10-CM | POA: Diagnosis not present

## 2023-03-09 DIAGNOSIS — Z992 Dependence on renal dialysis: Secondary | ICD-10-CM | POA: Diagnosis not present

## 2023-03-10 DIAGNOSIS — N2581 Secondary hyperparathyroidism of renal origin: Secondary | ICD-10-CM | POA: Diagnosis not present

## 2023-03-10 DIAGNOSIS — N186 End stage renal disease: Secondary | ICD-10-CM | POA: Diagnosis not present

## 2023-03-10 DIAGNOSIS — Z992 Dependence on renal dialysis: Secondary | ICD-10-CM | POA: Diagnosis not present

## 2023-03-12 DIAGNOSIS — N186 End stage renal disease: Secondary | ICD-10-CM | POA: Diagnosis not present

## 2023-03-12 DIAGNOSIS — N2581 Secondary hyperparathyroidism of renal origin: Secondary | ICD-10-CM | POA: Diagnosis not present

## 2023-03-12 DIAGNOSIS — Z992 Dependence on renal dialysis: Secondary | ICD-10-CM | POA: Diagnosis not present

## 2023-03-12 IMAGING — US US ABDOMEN COMPLETE
1 series · 14 of 25 positions shown · non-contrast
Comparison: CT January 20, 2012

CLINICAL DATA: Two years of epigastric pain

EXAM:
ABDOMEN ULTRASOUND COMPLETE

[Series 1: us abdomen complete · 14 of 122 slices shown]
[im 1/122]
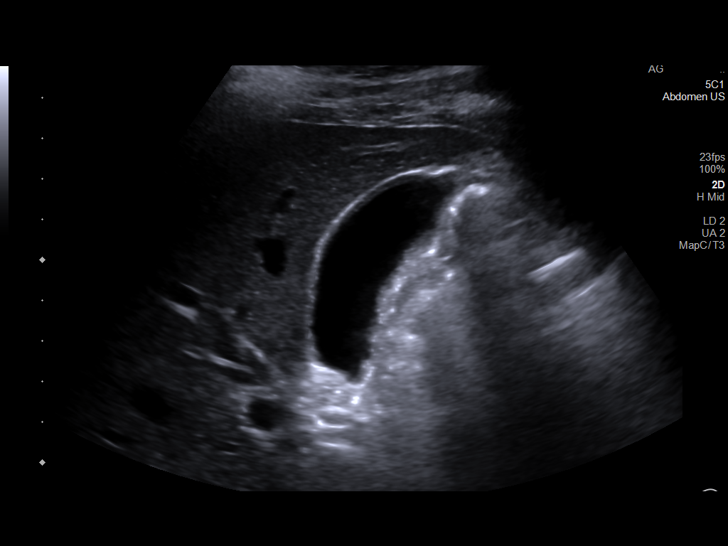
[im 11/122]
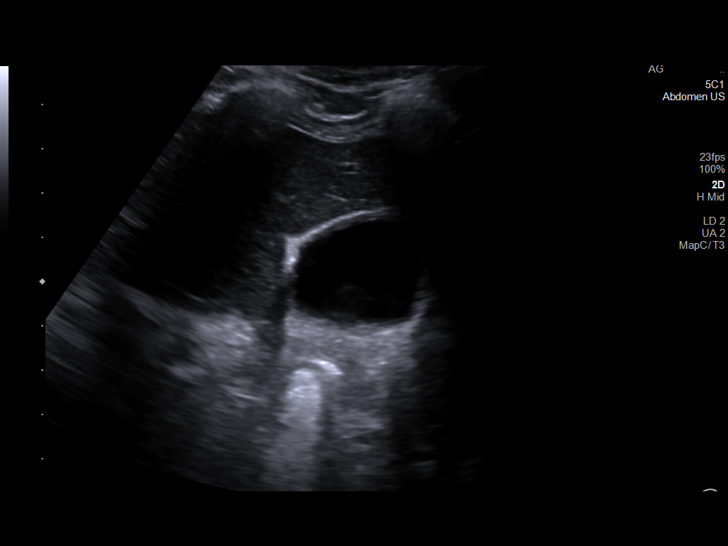
[im 21/122]
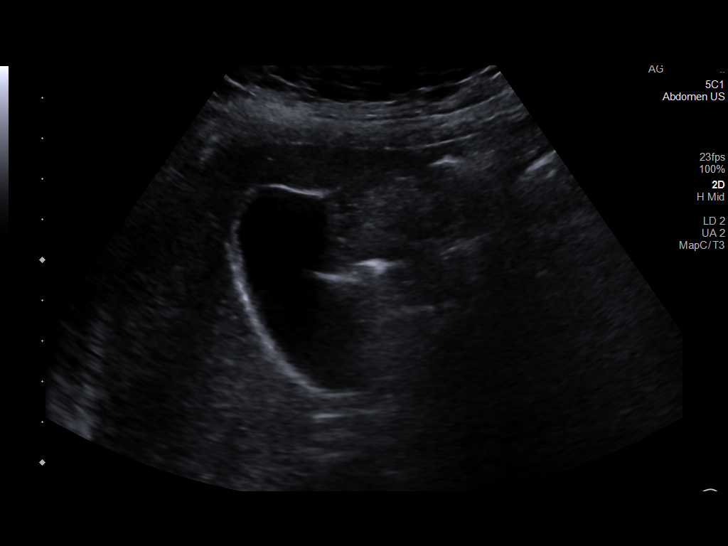
[im 31/122]
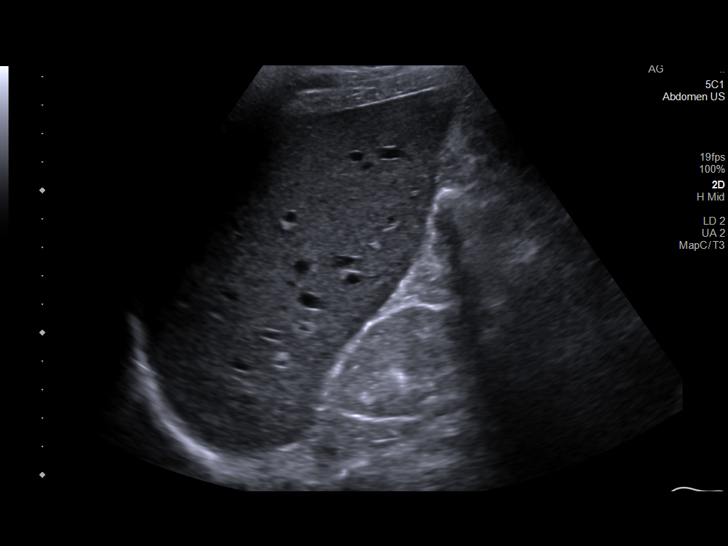
[im 41/122]
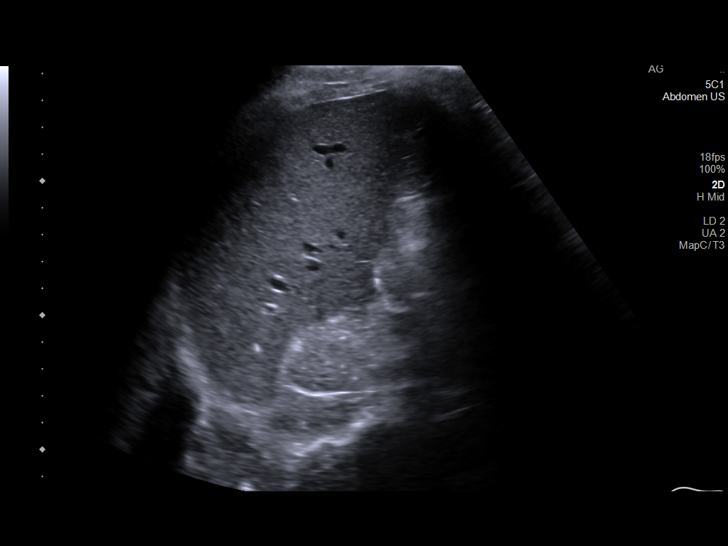
[im 46/122]
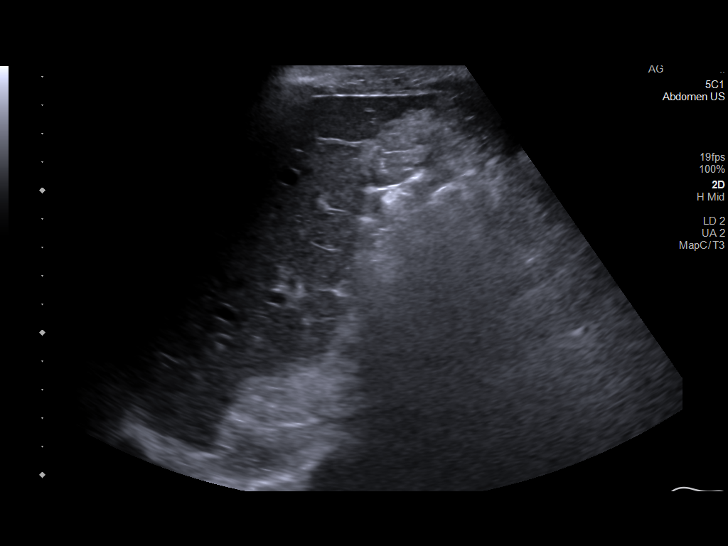
[im 56/122]
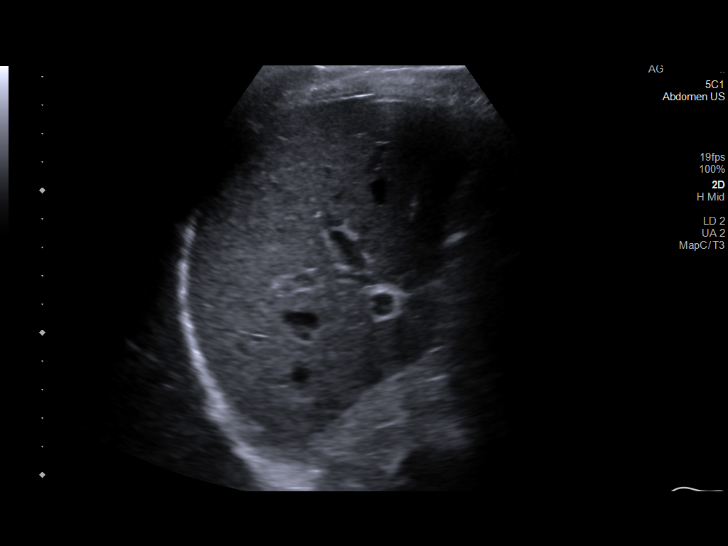
[im 66/122]
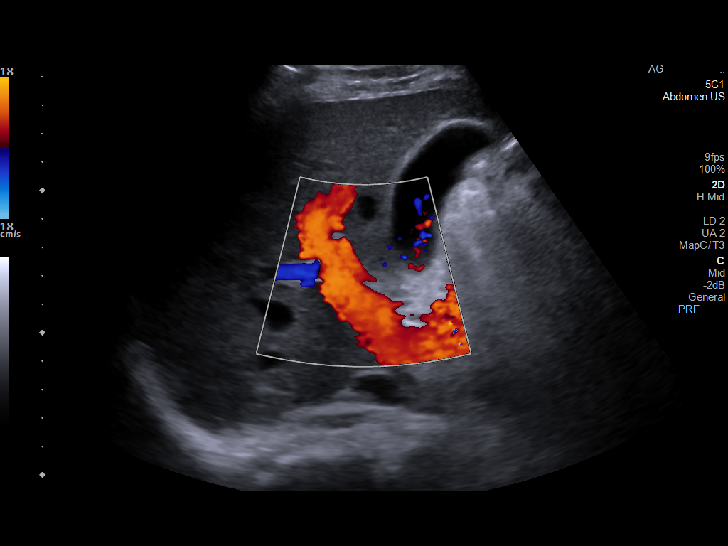
[im 76/122]
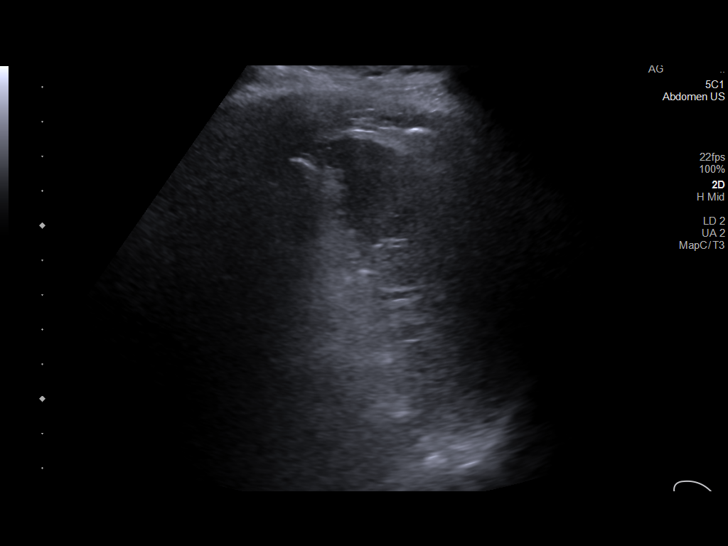
[im 81/122]
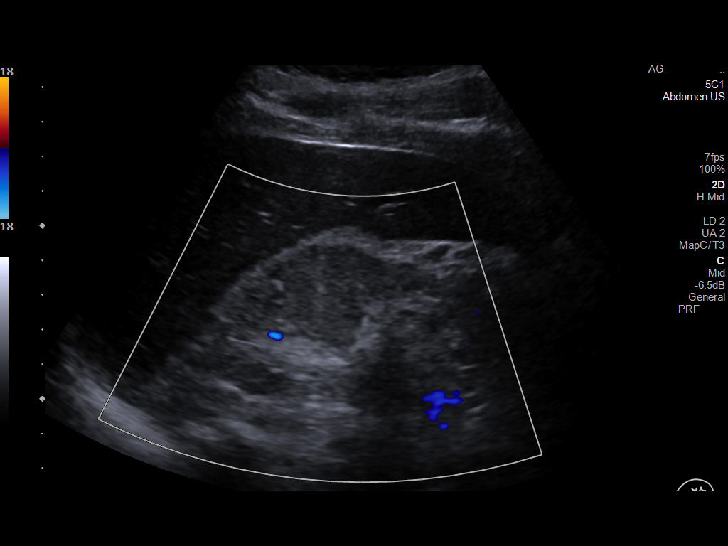
[im 91/122]
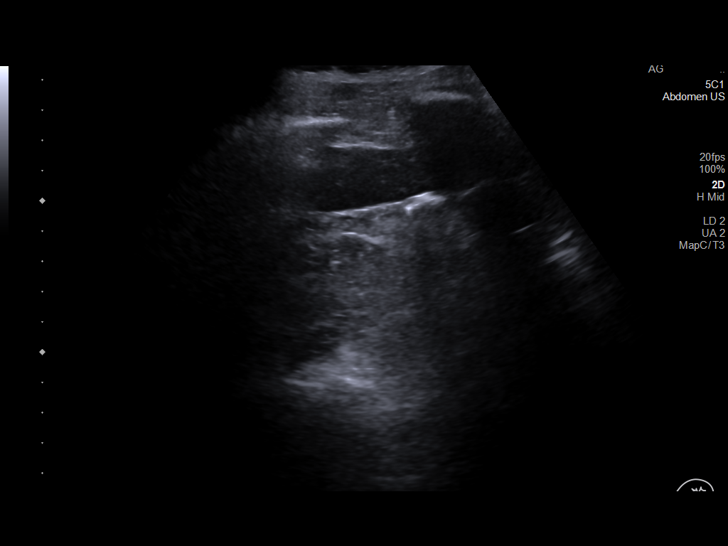
[im 101/122]
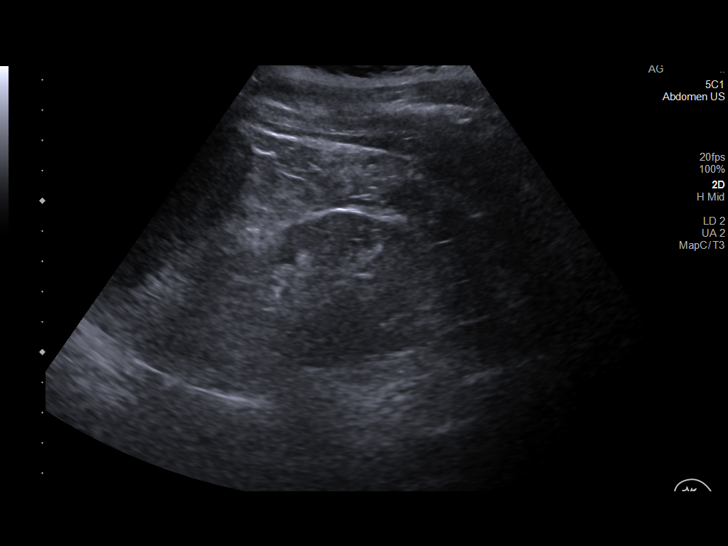
[im 111/122]
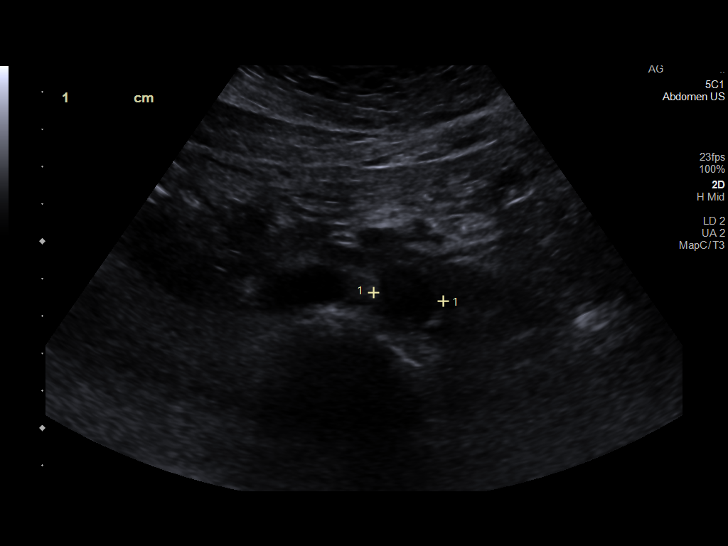
[im 122/122]
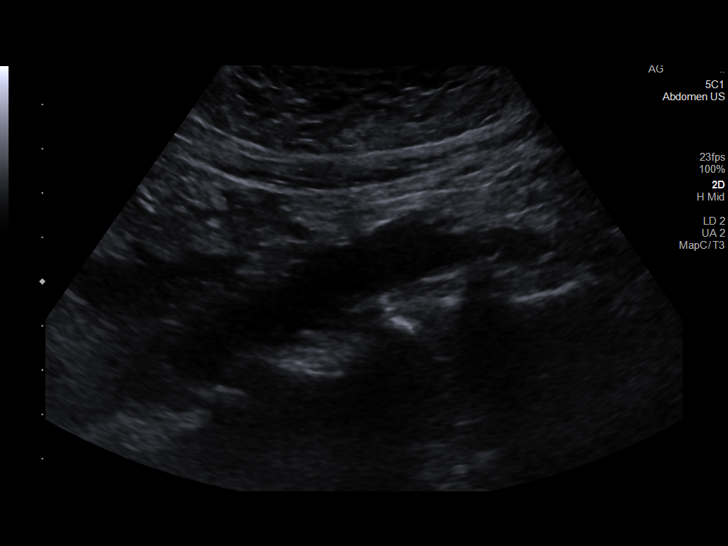

[14 of 25 positions shown; findings below may reference images not displayed]

FINDINGS: Gallbladder: No gallstones or wall thickening visualized. No
sonographic Murphy sign noted by sonographer.

Common bile duct: Diameter: 3 mm

Liver: No focal lesion identified. Within normal limits in
parenchymal echogenicity. Portal vein is patent on color Doppler
imaging with normal direction of blood flow towards the liver.

IVC: No abnormality visualized.

Pancreas: Visualized portion unremarkable.

Spleen: Size and appearance within normal limits.

Right Kidney: Length: 7.8 cm. Increased echogenicity. No mass or
hydronephrosis visualized.

Left Kidney: Length: 8.6 cm. Increased echogenicity. No mass or
hydronephrosis visualized.

Abdominal aorta: No aneurysm visualized.

Other findings: None.
IMPRESSION: 1. No acute abnormality. No findings to explain patient's symptoms.
2. Small, echogenic kidneys compatible with medical renal disease.

## 2023-03-14 DIAGNOSIS — Z992 Dependence on renal dialysis: Secondary | ICD-10-CM | POA: Diagnosis not present

## 2023-03-14 DIAGNOSIS — N2581 Secondary hyperparathyroidism of renal origin: Secondary | ICD-10-CM | POA: Diagnosis not present

## 2023-03-14 DIAGNOSIS — N186 End stage renal disease: Secondary | ICD-10-CM | POA: Diagnosis not present

## 2023-03-17 DIAGNOSIS — N186 End stage renal disease: Secondary | ICD-10-CM | POA: Diagnosis not present

## 2023-03-17 DIAGNOSIS — N2581 Secondary hyperparathyroidism of renal origin: Secondary | ICD-10-CM | POA: Diagnosis not present

## 2023-03-17 DIAGNOSIS — Z992 Dependence on renal dialysis: Secondary | ICD-10-CM | POA: Diagnosis not present

## 2023-03-19 DIAGNOSIS — N186 End stage renal disease: Secondary | ICD-10-CM | POA: Diagnosis not present

## 2023-03-19 DIAGNOSIS — Z992 Dependence on renal dialysis: Secondary | ICD-10-CM | POA: Diagnosis not present

## 2023-03-19 DIAGNOSIS — N2581 Secondary hyperparathyroidism of renal origin: Secondary | ICD-10-CM | POA: Diagnosis not present

## 2023-03-21 DIAGNOSIS — N186 End stage renal disease: Secondary | ICD-10-CM | POA: Diagnosis not present

## 2023-03-21 DIAGNOSIS — N2581 Secondary hyperparathyroidism of renal origin: Secondary | ICD-10-CM | POA: Diagnosis not present

## 2023-03-21 DIAGNOSIS — Z992 Dependence on renal dialysis: Secondary | ICD-10-CM | POA: Diagnosis not present

## 2023-03-24 DIAGNOSIS — N2581 Secondary hyperparathyroidism of renal origin: Secondary | ICD-10-CM | POA: Diagnosis not present

## 2023-03-24 DIAGNOSIS — N186 End stage renal disease: Secondary | ICD-10-CM | POA: Diagnosis not present

## 2023-03-24 DIAGNOSIS — Z992 Dependence on renal dialysis: Secondary | ICD-10-CM | POA: Diagnosis not present

## 2023-03-26 DIAGNOSIS — N2581 Secondary hyperparathyroidism of renal origin: Secondary | ICD-10-CM | POA: Diagnosis not present

## 2023-03-26 DIAGNOSIS — Z992 Dependence on renal dialysis: Secondary | ICD-10-CM | POA: Diagnosis not present

## 2023-03-26 DIAGNOSIS — N186 End stage renal disease: Secondary | ICD-10-CM | POA: Diagnosis not present

## 2023-03-28 DIAGNOSIS — Z992 Dependence on renal dialysis: Secondary | ICD-10-CM | POA: Diagnosis not present

## 2023-03-28 DIAGNOSIS — N2581 Secondary hyperparathyroidism of renal origin: Secondary | ICD-10-CM | POA: Diagnosis not present

## 2023-03-28 DIAGNOSIS — N186 End stage renal disease: Secondary | ICD-10-CM | POA: Diagnosis not present

## 2023-04-01 DIAGNOSIS — N186 End stage renal disease: Secondary | ICD-10-CM | POA: Diagnosis not present

## 2023-04-01 DIAGNOSIS — N2581 Secondary hyperparathyroidism of renal origin: Secondary | ICD-10-CM | POA: Diagnosis not present

## 2023-04-01 DIAGNOSIS — Z992 Dependence on renal dialysis: Secondary | ICD-10-CM | POA: Diagnosis not present

## 2023-04-02 DIAGNOSIS — N2581 Secondary hyperparathyroidism of renal origin: Secondary | ICD-10-CM | POA: Diagnosis not present

## 2023-04-02 DIAGNOSIS — Z992 Dependence on renal dialysis: Secondary | ICD-10-CM | POA: Diagnosis not present

## 2023-04-02 DIAGNOSIS — N186 End stage renal disease: Secondary | ICD-10-CM | POA: Diagnosis not present

## 2023-04-04 DIAGNOSIS — Z992 Dependence on renal dialysis: Secondary | ICD-10-CM | POA: Diagnosis not present

## 2023-04-04 DIAGNOSIS — N2581 Secondary hyperparathyroidism of renal origin: Secondary | ICD-10-CM | POA: Diagnosis not present

## 2023-04-04 DIAGNOSIS — N186 End stage renal disease: Secondary | ICD-10-CM | POA: Diagnosis not present

## 2023-04-07 DIAGNOSIS — N186 End stage renal disease: Secondary | ICD-10-CM | POA: Diagnosis not present

## 2023-04-07 DIAGNOSIS — Z992 Dependence on renal dialysis: Secondary | ICD-10-CM | POA: Diagnosis not present

## 2023-04-07 DIAGNOSIS — N2581 Secondary hyperparathyroidism of renal origin: Secondary | ICD-10-CM | POA: Diagnosis not present

## 2023-04-08 DIAGNOSIS — N186 End stage renal disease: Secondary | ICD-10-CM | POA: Diagnosis not present

## 2023-04-08 DIAGNOSIS — Z992 Dependence on renal dialysis: Secondary | ICD-10-CM | POA: Diagnosis not present

## 2023-04-09 DIAGNOSIS — N186 End stage renal disease: Secondary | ICD-10-CM | POA: Diagnosis not present

## 2023-04-09 DIAGNOSIS — Z992 Dependence on renal dialysis: Secondary | ICD-10-CM | POA: Diagnosis not present

## 2023-04-09 DIAGNOSIS — N2581 Secondary hyperparathyroidism of renal origin: Secondary | ICD-10-CM | POA: Diagnosis not present

## 2023-04-11 DIAGNOSIS — Z992 Dependence on renal dialysis: Secondary | ICD-10-CM | POA: Diagnosis not present

## 2023-04-11 DIAGNOSIS — N186 End stage renal disease: Secondary | ICD-10-CM | POA: Diagnosis not present

## 2023-04-11 DIAGNOSIS — N2581 Secondary hyperparathyroidism of renal origin: Secondary | ICD-10-CM | POA: Diagnosis not present

## 2023-04-14 DIAGNOSIS — N186 End stage renal disease: Secondary | ICD-10-CM | POA: Diagnosis not present

## 2023-04-14 DIAGNOSIS — N2581 Secondary hyperparathyroidism of renal origin: Secondary | ICD-10-CM | POA: Diagnosis not present

## 2023-04-14 DIAGNOSIS — Z992 Dependence on renal dialysis: Secondary | ICD-10-CM | POA: Diagnosis not present

## 2023-04-16 DIAGNOSIS — N186 End stage renal disease: Secondary | ICD-10-CM | POA: Diagnosis not present

## 2023-04-16 DIAGNOSIS — Z992 Dependence on renal dialysis: Secondary | ICD-10-CM | POA: Diagnosis not present

## 2023-04-16 DIAGNOSIS — N2581 Secondary hyperparathyroidism of renal origin: Secondary | ICD-10-CM | POA: Diagnosis not present

## 2023-04-18 DIAGNOSIS — Z992 Dependence on renal dialysis: Secondary | ICD-10-CM | POA: Diagnosis not present

## 2023-04-18 DIAGNOSIS — N186 End stage renal disease: Secondary | ICD-10-CM | POA: Diagnosis not present

## 2023-04-18 DIAGNOSIS — N2581 Secondary hyperparathyroidism of renal origin: Secondary | ICD-10-CM | POA: Diagnosis not present

## 2023-04-21 DIAGNOSIS — N186 End stage renal disease: Secondary | ICD-10-CM | POA: Diagnosis not present

## 2023-04-21 DIAGNOSIS — Z992 Dependence on renal dialysis: Secondary | ICD-10-CM | POA: Diagnosis not present

## 2023-04-21 DIAGNOSIS — N2581 Secondary hyperparathyroidism of renal origin: Secondary | ICD-10-CM | POA: Diagnosis not present

## 2023-04-23 DIAGNOSIS — N186 End stage renal disease: Secondary | ICD-10-CM | POA: Diagnosis not present

## 2023-04-23 DIAGNOSIS — N2581 Secondary hyperparathyroidism of renal origin: Secondary | ICD-10-CM | POA: Diagnosis not present

## 2023-04-23 DIAGNOSIS — Z992 Dependence on renal dialysis: Secondary | ICD-10-CM | POA: Diagnosis not present

## 2023-04-25 DIAGNOSIS — Z992 Dependence on renal dialysis: Secondary | ICD-10-CM | POA: Diagnosis not present

## 2023-04-25 DIAGNOSIS — N2581 Secondary hyperparathyroidism of renal origin: Secondary | ICD-10-CM | POA: Diagnosis not present

## 2023-04-25 DIAGNOSIS — N186 End stage renal disease: Secondary | ICD-10-CM | POA: Diagnosis not present

## 2023-04-28 DIAGNOSIS — N2581 Secondary hyperparathyroidism of renal origin: Secondary | ICD-10-CM | POA: Diagnosis not present

## 2023-04-28 DIAGNOSIS — Z992 Dependence on renal dialysis: Secondary | ICD-10-CM | POA: Diagnosis not present

## 2023-04-28 DIAGNOSIS — N186 End stage renal disease: Secondary | ICD-10-CM | POA: Diagnosis not present

## 2023-04-30 DIAGNOSIS — N186 End stage renal disease: Secondary | ICD-10-CM | POA: Diagnosis not present

## 2023-04-30 DIAGNOSIS — Z992 Dependence on renal dialysis: Secondary | ICD-10-CM | POA: Diagnosis not present

## 2023-04-30 DIAGNOSIS — N2581 Secondary hyperparathyroidism of renal origin: Secondary | ICD-10-CM | POA: Diagnosis not present

## 2023-05-02 DIAGNOSIS — Z992 Dependence on renal dialysis: Secondary | ICD-10-CM | POA: Diagnosis not present

## 2023-05-02 DIAGNOSIS — N2581 Secondary hyperparathyroidism of renal origin: Secondary | ICD-10-CM | POA: Diagnosis not present

## 2023-05-02 DIAGNOSIS — N186 End stage renal disease: Secondary | ICD-10-CM | POA: Diagnosis not present

## 2023-05-05 DIAGNOSIS — Z992 Dependence on renal dialysis: Secondary | ICD-10-CM | POA: Diagnosis not present

## 2023-05-05 DIAGNOSIS — N2581 Secondary hyperparathyroidism of renal origin: Secondary | ICD-10-CM | POA: Diagnosis not present

## 2023-05-05 DIAGNOSIS — N186 End stage renal disease: Secondary | ICD-10-CM | POA: Diagnosis not present

## 2023-05-07 DIAGNOSIS — N186 End stage renal disease: Secondary | ICD-10-CM | POA: Diagnosis not present

## 2023-05-07 DIAGNOSIS — N2581 Secondary hyperparathyroidism of renal origin: Secondary | ICD-10-CM | POA: Diagnosis not present

## 2023-05-07 DIAGNOSIS — Z992 Dependence on renal dialysis: Secondary | ICD-10-CM | POA: Diagnosis not present

## 2023-05-09 DIAGNOSIS — Z992 Dependence on renal dialysis: Secondary | ICD-10-CM | POA: Diagnosis not present

## 2023-05-09 DIAGNOSIS — N186 End stage renal disease: Secondary | ICD-10-CM | POA: Diagnosis not present

## 2023-05-09 DIAGNOSIS — N2581 Secondary hyperparathyroidism of renal origin: Secondary | ICD-10-CM | POA: Diagnosis not present

## 2023-05-12 DIAGNOSIS — Z992 Dependence on renal dialysis: Secondary | ICD-10-CM | POA: Diagnosis not present

## 2023-05-12 DIAGNOSIS — N186 End stage renal disease: Secondary | ICD-10-CM | POA: Diagnosis not present

## 2023-05-12 DIAGNOSIS — N2581 Secondary hyperparathyroidism of renal origin: Secondary | ICD-10-CM | POA: Diagnosis not present

## 2023-05-14 DIAGNOSIS — Z992 Dependence on renal dialysis: Secondary | ICD-10-CM | POA: Diagnosis not present

## 2023-05-14 DIAGNOSIS — N186 End stage renal disease: Secondary | ICD-10-CM | POA: Diagnosis not present

## 2023-05-14 DIAGNOSIS — N2581 Secondary hyperparathyroidism of renal origin: Secondary | ICD-10-CM | POA: Diagnosis not present

## 2023-05-16 DIAGNOSIS — N186 End stage renal disease: Secondary | ICD-10-CM | POA: Diagnosis not present

## 2023-05-16 DIAGNOSIS — N2581 Secondary hyperparathyroidism of renal origin: Secondary | ICD-10-CM | POA: Diagnosis not present

## 2023-05-16 DIAGNOSIS — Z992 Dependence on renal dialysis: Secondary | ICD-10-CM | POA: Diagnosis not present

## 2023-05-19 DIAGNOSIS — N2581 Secondary hyperparathyroidism of renal origin: Secondary | ICD-10-CM | POA: Diagnosis not present

## 2023-05-19 DIAGNOSIS — N186 End stage renal disease: Secondary | ICD-10-CM | POA: Diagnosis not present

## 2023-05-19 DIAGNOSIS — Z992 Dependence on renal dialysis: Secondary | ICD-10-CM | POA: Diagnosis not present

## 2023-05-20 DIAGNOSIS — N186 End stage renal disease: Secondary | ICD-10-CM | POA: Diagnosis not present

## 2023-05-20 DIAGNOSIS — Z992 Dependence on renal dialysis: Secondary | ICD-10-CM | POA: Diagnosis not present

## 2023-05-20 DIAGNOSIS — N2581 Secondary hyperparathyroidism of renal origin: Secondary | ICD-10-CM | POA: Diagnosis not present

## 2023-05-23 DIAGNOSIS — N2581 Secondary hyperparathyroidism of renal origin: Secondary | ICD-10-CM | POA: Diagnosis not present

## 2023-05-23 DIAGNOSIS — Z992 Dependence on renal dialysis: Secondary | ICD-10-CM | POA: Diagnosis not present

## 2023-05-23 DIAGNOSIS — N186 End stage renal disease: Secondary | ICD-10-CM | POA: Diagnosis not present

## 2023-05-26 DIAGNOSIS — Z992 Dependence on renal dialysis: Secondary | ICD-10-CM | POA: Diagnosis not present

## 2023-05-26 DIAGNOSIS — N2581 Secondary hyperparathyroidism of renal origin: Secondary | ICD-10-CM | POA: Diagnosis not present

## 2023-05-26 DIAGNOSIS — N186 End stage renal disease: Secondary | ICD-10-CM | POA: Diagnosis not present

## 2023-05-28 DIAGNOSIS — N186 End stage renal disease: Secondary | ICD-10-CM | POA: Diagnosis not present

## 2023-05-28 DIAGNOSIS — N2581 Secondary hyperparathyroidism of renal origin: Secondary | ICD-10-CM | POA: Diagnosis not present

## 2023-05-28 DIAGNOSIS — Z992 Dependence on renal dialysis: Secondary | ICD-10-CM | POA: Diagnosis not present

## 2023-05-30 DIAGNOSIS — N186 End stage renal disease: Secondary | ICD-10-CM | POA: Diagnosis not present

## 2023-05-30 DIAGNOSIS — Z992 Dependence on renal dialysis: Secondary | ICD-10-CM | POA: Diagnosis not present

## 2023-05-30 DIAGNOSIS — N2581 Secondary hyperparathyroidism of renal origin: Secondary | ICD-10-CM | POA: Diagnosis not present

## 2023-06-02 DIAGNOSIS — Z992 Dependence on renal dialysis: Secondary | ICD-10-CM | POA: Diagnosis not present

## 2023-06-02 DIAGNOSIS — N186 End stage renal disease: Secondary | ICD-10-CM | POA: Diagnosis not present

## 2023-06-02 DIAGNOSIS — N2581 Secondary hyperparathyroidism of renal origin: Secondary | ICD-10-CM | POA: Diagnosis not present

## 2023-06-06 DIAGNOSIS — N2581 Secondary hyperparathyroidism of renal origin: Secondary | ICD-10-CM | POA: Diagnosis not present

## 2023-06-06 DIAGNOSIS — N186 End stage renal disease: Secondary | ICD-10-CM | POA: Diagnosis not present

## 2023-06-06 DIAGNOSIS — Z992 Dependence on renal dialysis: Secondary | ICD-10-CM | POA: Diagnosis not present

## 2023-06-08 DIAGNOSIS — Z992 Dependence on renal dialysis: Secondary | ICD-10-CM | POA: Diagnosis not present

## 2023-06-08 DIAGNOSIS — N186 End stage renal disease: Secondary | ICD-10-CM | POA: Diagnosis not present

## 2023-06-09 DIAGNOSIS — N2581 Secondary hyperparathyroidism of renal origin: Secondary | ICD-10-CM | POA: Diagnosis not present

## 2023-06-09 DIAGNOSIS — Z992 Dependence on renal dialysis: Secondary | ICD-10-CM | POA: Diagnosis not present

## 2023-06-09 DIAGNOSIS — N186 End stage renal disease: Secondary | ICD-10-CM | POA: Diagnosis not present

## 2023-06-11 DIAGNOSIS — N186 End stage renal disease: Secondary | ICD-10-CM | POA: Diagnosis not present

## 2023-06-11 DIAGNOSIS — N2581 Secondary hyperparathyroidism of renal origin: Secondary | ICD-10-CM | POA: Diagnosis not present

## 2023-06-11 DIAGNOSIS — Z992 Dependence on renal dialysis: Secondary | ICD-10-CM | POA: Diagnosis not present

## 2023-06-13 DIAGNOSIS — Z992 Dependence on renal dialysis: Secondary | ICD-10-CM | POA: Diagnosis not present

## 2023-06-13 DIAGNOSIS — N186 End stage renal disease: Secondary | ICD-10-CM | POA: Diagnosis not present

## 2023-06-13 DIAGNOSIS — N2581 Secondary hyperparathyroidism of renal origin: Secondary | ICD-10-CM | POA: Diagnosis not present

## 2023-06-16 DIAGNOSIS — Z6823 Body mass index (BMI) 23.0-23.9, adult: Secondary | ICD-10-CM | POA: Diagnosis not present

## 2023-06-16 DIAGNOSIS — E213 Hyperparathyroidism, unspecified: Secondary | ICD-10-CM | POA: Diagnosis not present

## 2023-06-16 DIAGNOSIS — I10 Essential (primary) hypertension: Secondary | ICD-10-CM | POA: Diagnosis not present

## 2023-06-16 DIAGNOSIS — N186 End stage renal disease: Secondary | ICD-10-CM | POA: Diagnosis not present

## 2023-06-16 DIAGNOSIS — N2581 Secondary hyperparathyroidism of renal origin: Secondary | ICD-10-CM | POA: Diagnosis not present

## 2023-06-16 DIAGNOSIS — K219 Gastro-esophageal reflux disease without esophagitis: Secondary | ICD-10-CM | POA: Diagnosis not present

## 2023-06-16 DIAGNOSIS — Z992 Dependence on renal dialysis: Secondary | ICD-10-CM | POA: Diagnosis not present

## 2023-06-18 DIAGNOSIS — Z992 Dependence on renal dialysis: Secondary | ICD-10-CM | POA: Diagnosis not present

## 2023-06-18 DIAGNOSIS — N2581 Secondary hyperparathyroidism of renal origin: Secondary | ICD-10-CM | POA: Diagnosis not present

## 2023-06-18 DIAGNOSIS — N186 End stage renal disease: Secondary | ICD-10-CM | POA: Diagnosis not present

## 2023-06-20 DIAGNOSIS — N186 End stage renal disease: Secondary | ICD-10-CM | POA: Diagnosis not present

## 2023-06-20 DIAGNOSIS — N2581 Secondary hyperparathyroidism of renal origin: Secondary | ICD-10-CM | POA: Diagnosis not present

## 2023-06-20 DIAGNOSIS — Z992 Dependence on renal dialysis: Secondary | ICD-10-CM | POA: Diagnosis not present

## 2023-06-23 DIAGNOSIS — N2581 Secondary hyperparathyroidism of renal origin: Secondary | ICD-10-CM | POA: Diagnosis not present

## 2023-06-23 DIAGNOSIS — N186 End stage renal disease: Secondary | ICD-10-CM | POA: Diagnosis not present

## 2023-06-23 DIAGNOSIS — Z992 Dependence on renal dialysis: Secondary | ICD-10-CM | POA: Diagnosis not present

## 2023-06-25 DIAGNOSIS — N186 End stage renal disease: Secondary | ICD-10-CM | POA: Diagnosis not present

## 2023-06-25 DIAGNOSIS — N2581 Secondary hyperparathyroidism of renal origin: Secondary | ICD-10-CM | POA: Diagnosis not present

## 2023-06-25 DIAGNOSIS — Z992 Dependence on renal dialysis: Secondary | ICD-10-CM | POA: Diagnosis not present

## 2023-06-27 DIAGNOSIS — N186 End stage renal disease: Secondary | ICD-10-CM | POA: Diagnosis not present

## 2023-06-27 DIAGNOSIS — Z992 Dependence on renal dialysis: Secondary | ICD-10-CM | POA: Diagnosis not present

## 2023-06-27 DIAGNOSIS — N2581 Secondary hyperparathyroidism of renal origin: Secondary | ICD-10-CM | POA: Diagnosis not present

## 2023-06-30 DIAGNOSIS — N186 End stage renal disease: Secondary | ICD-10-CM | POA: Diagnosis not present

## 2023-06-30 DIAGNOSIS — Z992 Dependence on renal dialysis: Secondary | ICD-10-CM | POA: Diagnosis not present

## 2023-06-30 DIAGNOSIS — N2581 Secondary hyperparathyroidism of renal origin: Secondary | ICD-10-CM | POA: Diagnosis not present

## 2023-07-02 DIAGNOSIS — N2581 Secondary hyperparathyroidism of renal origin: Secondary | ICD-10-CM | POA: Diagnosis not present

## 2023-07-02 DIAGNOSIS — Z992 Dependence on renal dialysis: Secondary | ICD-10-CM | POA: Diagnosis not present

## 2023-07-02 DIAGNOSIS — N186 End stage renal disease: Secondary | ICD-10-CM | POA: Diagnosis not present

## 2023-07-04 DIAGNOSIS — N2581 Secondary hyperparathyroidism of renal origin: Secondary | ICD-10-CM | POA: Diagnosis not present

## 2023-07-04 DIAGNOSIS — N186 End stage renal disease: Secondary | ICD-10-CM | POA: Diagnosis not present

## 2023-07-04 DIAGNOSIS — Z992 Dependence on renal dialysis: Secondary | ICD-10-CM | POA: Diagnosis not present

## 2023-07-07 DIAGNOSIS — N2581 Secondary hyperparathyroidism of renal origin: Secondary | ICD-10-CM | POA: Diagnosis not present

## 2023-07-07 DIAGNOSIS — N186 End stage renal disease: Secondary | ICD-10-CM | POA: Diagnosis not present

## 2023-07-07 DIAGNOSIS — Z992 Dependence on renal dialysis: Secondary | ICD-10-CM | POA: Diagnosis not present

## 2023-07-09 DIAGNOSIS — N186 End stage renal disease: Secondary | ICD-10-CM | POA: Diagnosis not present

## 2023-07-09 DIAGNOSIS — Z992 Dependence on renal dialysis: Secondary | ICD-10-CM | POA: Diagnosis not present

## 2023-07-09 DIAGNOSIS — N2581 Secondary hyperparathyroidism of renal origin: Secondary | ICD-10-CM | POA: Diagnosis not present

## 2023-07-11 DIAGNOSIS — Z23 Encounter for immunization: Secondary | ICD-10-CM | POA: Diagnosis not present

## 2023-07-11 DIAGNOSIS — N2581 Secondary hyperparathyroidism of renal origin: Secondary | ICD-10-CM | POA: Diagnosis not present

## 2023-07-11 DIAGNOSIS — N186 End stage renal disease: Secondary | ICD-10-CM | POA: Diagnosis not present

## 2023-07-11 DIAGNOSIS — Z992 Dependence on renal dialysis: Secondary | ICD-10-CM | POA: Diagnosis not present

## 2023-07-14 DIAGNOSIS — Z992 Dependence on renal dialysis: Secondary | ICD-10-CM | POA: Diagnosis not present

## 2023-07-14 DIAGNOSIS — N2581 Secondary hyperparathyroidism of renal origin: Secondary | ICD-10-CM | POA: Diagnosis not present

## 2023-07-14 DIAGNOSIS — N186 End stage renal disease: Secondary | ICD-10-CM | POA: Diagnosis not present

## 2023-07-14 DIAGNOSIS — Z23 Encounter for immunization: Secondary | ICD-10-CM | POA: Diagnosis not present

## 2023-07-16 DIAGNOSIS — Z23 Encounter for immunization: Secondary | ICD-10-CM | POA: Diagnosis not present

## 2023-07-16 DIAGNOSIS — N186 End stage renal disease: Secondary | ICD-10-CM | POA: Diagnosis not present

## 2023-07-16 DIAGNOSIS — Z992 Dependence on renal dialysis: Secondary | ICD-10-CM | POA: Diagnosis not present

## 2023-07-16 DIAGNOSIS — N2581 Secondary hyperparathyroidism of renal origin: Secondary | ICD-10-CM | POA: Diagnosis not present

## 2023-07-18 DIAGNOSIS — Z992 Dependence on renal dialysis: Secondary | ICD-10-CM | POA: Diagnosis not present

## 2023-07-18 DIAGNOSIS — Z23 Encounter for immunization: Secondary | ICD-10-CM | POA: Diagnosis not present

## 2023-07-18 DIAGNOSIS — N186 End stage renal disease: Secondary | ICD-10-CM | POA: Diagnosis not present

## 2023-07-18 DIAGNOSIS — N2581 Secondary hyperparathyroidism of renal origin: Secondary | ICD-10-CM | POA: Diagnosis not present

## 2023-07-21 DIAGNOSIS — N186 End stage renal disease: Secondary | ICD-10-CM | POA: Diagnosis not present

## 2023-07-21 DIAGNOSIS — Z23 Encounter for immunization: Secondary | ICD-10-CM | POA: Diagnosis not present

## 2023-07-21 DIAGNOSIS — N2581 Secondary hyperparathyroidism of renal origin: Secondary | ICD-10-CM | POA: Diagnosis not present

## 2023-07-21 DIAGNOSIS — Z992 Dependence on renal dialysis: Secondary | ICD-10-CM | POA: Diagnosis not present

## 2023-07-23 DIAGNOSIS — N186 End stage renal disease: Secondary | ICD-10-CM | POA: Diagnosis not present

## 2023-07-23 DIAGNOSIS — Z23 Encounter for immunization: Secondary | ICD-10-CM | POA: Diagnosis not present

## 2023-07-23 DIAGNOSIS — Z992 Dependence on renal dialysis: Secondary | ICD-10-CM | POA: Diagnosis not present

## 2023-07-23 DIAGNOSIS — N2581 Secondary hyperparathyroidism of renal origin: Secondary | ICD-10-CM | POA: Diagnosis not present

## 2023-07-25 DIAGNOSIS — N2581 Secondary hyperparathyroidism of renal origin: Secondary | ICD-10-CM | POA: Diagnosis not present

## 2023-07-25 DIAGNOSIS — Z23 Encounter for immunization: Secondary | ICD-10-CM | POA: Diagnosis not present

## 2023-07-25 DIAGNOSIS — Z992 Dependence on renal dialysis: Secondary | ICD-10-CM | POA: Diagnosis not present

## 2023-07-25 DIAGNOSIS — N186 End stage renal disease: Secondary | ICD-10-CM | POA: Diagnosis not present

## 2023-07-28 DIAGNOSIS — Z992 Dependence on renal dialysis: Secondary | ICD-10-CM | POA: Diagnosis not present

## 2023-07-28 DIAGNOSIS — N186 End stage renal disease: Secondary | ICD-10-CM | POA: Diagnosis not present

## 2023-07-28 DIAGNOSIS — Z23 Encounter for immunization: Secondary | ICD-10-CM | POA: Diagnosis not present

## 2023-07-28 DIAGNOSIS — N2581 Secondary hyperparathyroidism of renal origin: Secondary | ICD-10-CM | POA: Diagnosis not present

## 2023-07-30 DIAGNOSIS — Z992 Dependence on renal dialysis: Secondary | ICD-10-CM | POA: Diagnosis not present

## 2023-07-30 DIAGNOSIS — N186 End stage renal disease: Secondary | ICD-10-CM | POA: Diagnosis not present

## 2023-07-30 DIAGNOSIS — N2581 Secondary hyperparathyroidism of renal origin: Secondary | ICD-10-CM | POA: Diagnosis not present

## 2023-07-30 DIAGNOSIS — Z23 Encounter for immunization: Secondary | ICD-10-CM | POA: Diagnosis not present

## 2023-08-01 DIAGNOSIS — N2581 Secondary hyperparathyroidism of renal origin: Secondary | ICD-10-CM | POA: Diagnosis not present

## 2023-08-01 DIAGNOSIS — N186 End stage renal disease: Secondary | ICD-10-CM | POA: Diagnosis not present

## 2023-08-01 DIAGNOSIS — Z23 Encounter for immunization: Secondary | ICD-10-CM | POA: Diagnosis not present

## 2023-08-01 DIAGNOSIS — Z992 Dependence on renal dialysis: Secondary | ICD-10-CM | POA: Diagnosis not present

## 2023-08-04 DIAGNOSIS — N2581 Secondary hyperparathyroidism of renal origin: Secondary | ICD-10-CM | POA: Diagnosis not present

## 2023-08-04 DIAGNOSIS — N186 End stage renal disease: Secondary | ICD-10-CM | POA: Diagnosis not present

## 2023-08-04 DIAGNOSIS — Z23 Encounter for immunization: Secondary | ICD-10-CM | POA: Diagnosis not present

## 2023-08-04 DIAGNOSIS — Z992 Dependence on renal dialysis: Secondary | ICD-10-CM | POA: Diagnosis not present

## 2023-08-06 DIAGNOSIS — N186 End stage renal disease: Secondary | ICD-10-CM | POA: Diagnosis not present

## 2023-08-06 DIAGNOSIS — Z23 Encounter for immunization: Secondary | ICD-10-CM | POA: Diagnosis not present

## 2023-08-06 DIAGNOSIS — Z992 Dependence on renal dialysis: Secondary | ICD-10-CM | POA: Diagnosis not present

## 2023-08-06 DIAGNOSIS — N2581 Secondary hyperparathyroidism of renal origin: Secondary | ICD-10-CM | POA: Diagnosis not present

## 2023-08-08 DIAGNOSIS — N186 End stage renal disease: Secondary | ICD-10-CM | POA: Diagnosis not present

## 2023-08-08 DIAGNOSIS — Z23 Encounter for immunization: Secondary | ICD-10-CM | POA: Diagnosis not present

## 2023-08-08 DIAGNOSIS — Z992 Dependence on renal dialysis: Secondary | ICD-10-CM | POA: Diagnosis not present

## 2023-08-08 DIAGNOSIS — N2581 Secondary hyperparathyroidism of renal origin: Secondary | ICD-10-CM | POA: Diagnosis not present

## 2023-08-09 DIAGNOSIS — Z992 Dependence on renal dialysis: Secondary | ICD-10-CM | POA: Diagnosis not present

## 2023-08-09 DIAGNOSIS — N186 End stage renal disease: Secondary | ICD-10-CM | POA: Diagnosis not present

## 2023-08-11 DIAGNOSIS — N186 End stage renal disease: Secondary | ICD-10-CM | POA: Diagnosis not present

## 2023-08-11 DIAGNOSIS — Z992 Dependence on renal dialysis: Secondary | ICD-10-CM | POA: Diagnosis not present

## 2023-08-11 DIAGNOSIS — N2581 Secondary hyperparathyroidism of renal origin: Secondary | ICD-10-CM | POA: Diagnosis not present

## 2023-08-13 DIAGNOSIS — N186 End stage renal disease: Secondary | ICD-10-CM | POA: Diagnosis not present

## 2023-08-13 DIAGNOSIS — N2581 Secondary hyperparathyroidism of renal origin: Secondary | ICD-10-CM | POA: Diagnosis not present

## 2023-08-13 DIAGNOSIS — Z992 Dependence on renal dialysis: Secondary | ICD-10-CM | POA: Diagnosis not present

## 2023-08-15 DIAGNOSIS — N186 End stage renal disease: Secondary | ICD-10-CM | POA: Diagnosis not present

## 2023-08-15 DIAGNOSIS — N2581 Secondary hyperparathyroidism of renal origin: Secondary | ICD-10-CM | POA: Diagnosis not present

## 2023-08-15 DIAGNOSIS — Z992 Dependence on renal dialysis: Secondary | ICD-10-CM | POA: Diagnosis not present

## 2023-08-18 DIAGNOSIS — N2581 Secondary hyperparathyroidism of renal origin: Secondary | ICD-10-CM | POA: Diagnosis not present

## 2023-08-18 DIAGNOSIS — Z992 Dependence on renal dialysis: Secondary | ICD-10-CM | POA: Diagnosis not present

## 2023-08-18 DIAGNOSIS — N186 End stage renal disease: Secondary | ICD-10-CM | POA: Diagnosis not present

## 2023-08-20 DIAGNOSIS — Z992 Dependence on renal dialysis: Secondary | ICD-10-CM | POA: Diagnosis not present

## 2023-08-20 DIAGNOSIS — N186 End stage renal disease: Secondary | ICD-10-CM | POA: Diagnosis not present

## 2023-08-20 DIAGNOSIS — N2581 Secondary hyperparathyroidism of renal origin: Secondary | ICD-10-CM | POA: Diagnosis not present

## 2023-08-22 DIAGNOSIS — N186 End stage renal disease: Secondary | ICD-10-CM | POA: Diagnosis not present

## 2023-08-22 DIAGNOSIS — Z992 Dependence on renal dialysis: Secondary | ICD-10-CM | POA: Diagnosis not present

## 2023-08-22 DIAGNOSIS — N2581 Secondary hyperparathyroidism of renal origin: Secondary | ICD-10-CM | POA: Diagnosis not present

## 2023-08-25 DIAGNOSIS — N2581 Secondary hyperparathyroidism of renal origin: Secondary | ICD-10-CM | POA: Diagnosis not present

## 2023-08-25 DIAGNOSIS — N186 End stage renal disease: Secondary | ICD-10-CM | POA: Diagnosis not present

## 2023-08-25 DIAGNOSIS — Z992 Dependence on renal dialysis: Secondary | ICD-10-CM | POA: Diagnosis not present

## 2023-08-27 DIAGNOSIS — N2581 Secondary hyperparathyroidism of renal origin: Secondary | ICD-10-CM | POA: Diagnosis not present

## 2023-08-27 DIAGNOSIS — N186 End stage renal disease: Secondary | ICD-10-CM | POA: Diagnosis not present

## 2023-08-27 DIAGNOSIS — Z992 Dependence on renal dialysis: Secondary | ICD-10-CM | POA: Diagnosis not present

## 2023-08-29 DIAGNOSIS — Z992 Dependence on renal dialysis: Secondary | ICD-10-CM | POA: Diagnosis not present

## 2023-08-29 DIAGNOSIS — N2581 Secondary hyperparathyroidism of renal origin: Secondary | ICD-10-CM | POA: Diagnosis not present

## 2023-08-29 DIAGNOSIS — N186 End stage renal disease: Secondary | ICD-10-CM | POA: Diagnosis not present

## 2023-09-01 DIAGNOSIS — N2581 Secondary hyperparathyroidism of renal origin: Secondary | ICD-10-CM | POA: Diagnosis not present

## 2023-09-01 DIAGNOSIS — N186 End stage renal disease: Secondary | ICD-10-CM | POA: Diagnosis not present

## 2023-09-01 DIAGNOSIS — Z992 Dependence on renal dialysis: Secondary | ICD-10-CM | POA: Diagnosis not present

## 2023-09-03 DIAGNOSIS — N2581 Secondary hyperparathyroidism of renal origin: Secondary | ICD-10-CM | POA: Diagnosis not present

## 2023-09-03 DIAGNOSIS — N186 End stage renal disease: Secondary | ICD-10-CM | POA: Diagnosis not present

## 2023-09-03 DIAGNOSIS — Z992 Dependence on renal dialysis: Secondary | ICD-10-CM | POA: Diagnosis not present

## 2023-09-05 DIAGNOSIS — N186 End stage renal disease: Secondary | ICD-10-CM | POA: Diagnosis not present

## 2023-09-05 DIAGNOSIS — Z992 Dependence on renal dialysis: Secondary | ICD-10-CM | POA: Diagnosis not present

## 2023-09-05 DIAGNOSIS — N2581 Secondary hyperparathyroidism of renal origin: Secondary | ICD-10-CM | POA: Diagnosis not present

## 2023-09-08 DIAGNOSIS — N186 End stage renal disease: Secondary | ICD-10-CM | POA: Diagnosis not present

## 2023-09-08 DIAGNOSIS — N2581 Secondary hyperparathyroidism of renal origin: Secondary | ICD-10-CM | POA: Diagnosis not present

## 2023-09-08 DIAGNOSIS — Z992 Dependence on renal dialysis: Secondary | ICD-10-CM | POA: Diagnosis not present

## 2023-09-10 DIAGNOSIS — N2581 Secondary hyperparathyroidism of renal origin: Secondary | ICD-10-CM | POA: Diagnosis not present

## 2023-09-10 DIAGNOSIS — Z992 Dependence on renal dialysis: Secondary | ICD-10-CM | POA: Diagnosis not present

## 2023-09-10 DIAGNOSIS — N186 End stage renal disease: Secondary | ICD-10-CM | POA: Diagnosis not present

## 2023-09-10 DIAGNOSIS — Z23 Encounter for immunization: Secondary | ICD-10-CM | POA: Diagnosis not present

## 2023-09-12 DIAGNOSIS — Z992 Dependence on renal dialysis: Secondary | ICD-10-CM | POA: Diagnosis not present

## 2023-09-12 DIAGNOSIS — N2581 Secondary hyperparathyroidism of renal origin: Secondary | ICD-10-CM | POA: Diagnosis not present

## 2023-09-12 DIAGNOSIS — N186 End stage renal disease: Secondary | ICD-10-CM | POA: Diagnosis not present

## 2023-09-12 DIAGNOSIS — Z23 Encounter for immunization: Secondary | ICD-10-CM | POA: Diagnosis not present

## 2023-09-15 DIAGNOSIS — N2581 Secondary hyperparathyroidism of renal origin: Secondary | ICD-10-CM | POA: Diagnosis not present

## 2023-09-15 DIAGNOSIS — N186 End stage renal disease: Secondary | ICD-10-CM | POA: Diagnosis not present

## 2023-09-15 DIAGNOSIS — Z992 Dependence on renal dialysis: Secondary | ICD-10-CM | POA: Diagnosis not present

## 2023-09-15 DIAGNOSIS — Z23 Encounter for immunization: Secondary | ICD-10-CM | POA: Diagnosis not present

## 2023-09-17 DIAGNOSIS — N186 End stage renal disease: Secondary | ICD-10-CM | POA: Diagnosis not present

## 2023-09-17 DIAGNOSIS — Z23 Encounter for immunization: Secondary | ICD-10-CM | POA: Diagnosis not present

## 2023-09-17 DIAGNOSIS — Z992 Dependence on renal dialysis: Secondary | ICD-10-CM | POA: Diagnosis not present

## 2023-09-17 DIAGNOSIS — N2581 Secondary hyperparathyroidism of renal origin: Secondary | ICD-10-CM | POA: Diagnosis not present

## 2023-09-19 DIAGNOSIS — N186 End stage renal disease: Secondary | ICD-10-CM | POA: Diagnosis not present

## 2023-09-19 DIAGNOSIS — Z992 Dependence on renal dialysis: Secondary | ICD-10-CM | POA: Diagnosis not present

## 2023-09-19 DIAGNOSIS — Z23 Encounter for immunization: Secondary | ICD-10-CM | POA: Diagnosis not present

## 2023-09-19 DIAGNOSIS — N2581 Secondary hyperparathyroidism of renal origin: Secondary | ICD-10-CM | POA: Diagnosis not present

## 2023-09-24 DIAGNOSIS — Z23 Encounter for immunization: Secondary | ICD-10-CM | POA: Diagnosis not present

## 2023-09-24 DIAGNOSIS — N186 End stage renal disease: Secondary | ICD-10-CM | POA: Diagnosis not present

## 2023-09-24 DIAGNOSIS — Z992 Dependence on renal dialysis: Secondary | ICD-10-CM | POA: Diagnosis not present

## 2023-09-24 DIAGNOSIS — N2581 Secondary hyperparathyroidism of renal origin: Secondary | ICD-10-CM | POA: Diagnosis not present

## 2023-09-26 DIAGNOSIS — Z992 Dependence on renal dialysis: Secondary | ICD-10-CM | POA: Diagnosis not present

## 2023-09-26 DIAGNOSIS — N2581 Secondary hyperparathyroidism of renal origin: Secondary | ICD-10-CM | POA: Diagnosis not present

## 2023-09-26 DIAGNOSIS — N186 End stage renal disease: Secondary | ICD-10-CM | POA: Diagnosis not present

## 2023-09-26 DIAGNOSIS — Z23 Encounter for immunization: Secondary | ICD-10-CM | POA: Diagnosis not present

## 2023-09-29 DIAGNOSIS — Z23 Encounter for immunization: Secondary | ICD-10-CM | POA: Diagnosis not present

## 2023-09-29 DIAGNOSIS — N2581 Secondary hyperparathyroidism of renal origin: Secondary | ICD-10-CM | POA: Diagnosis not present

## 2023-09-29 DIAGNOSIS — Z992 Dependence on renal dialysis: Secondary | ICD-10-CM | POA: Diagnosis not present

## 2023-09-29 DIAGNOSIS — N186 End stage renal disease: Secondary | ICD-10-CM | POA: Diagnosis not present

## 2023-09-30 DIAGNOSIS — K219 Gastro-esophageal reflux disease without esophagitis: Secondary | ICD-10-CM | POA: Diagnosis not present

## 2023-09-30 DIAGNOSIS — N186 End stage renal disease: Secondary | ICD-10-CM | POA: Diagnosis not present

## 2023-09-30 DIAGNOSIS — I10 Essential (primary) hypertension: Secondary | ICD-10-CM | POA: Diagnosis not present

## 2023-09-30 DIAGNOSIS — E213 Hyperparathyroidism, unspecified: Secondary | ICD-10-CM | POA: Diagnosis not present

## 2023-09-30 DIAGNOSIS — Z0000 Encounter for general adult medical examination without abnormal findings: Secondary | ICD-10-CM | POA: Diagnosis not present

## 2023-10-01 DIAGNOSIS — Z992 Dependence on renal dialysis: Secondary | ICD-10-CM | POA: Diagnosis not present

## 2023-10-01 DIAGNOSIS — N186 End stage renal disease: Secondary | ICD-10-CM | POA: Diagnosis not present

## 2023-10-01 DIAGNOSIS — N2581 Secondary hyperparathyroidism of renal origin: Secondary | ICD-10-CM | POA: Diagnosis not present

## 2023-10-01 DIAGNOSIS — Z23 Encounter for immunization: Secondary | ICD-10-CM | POA: Diagnosis not present

## 2023-10-03 DIAGNOSIS — N186 End stage renal disease: Secondary | ICD-10-CM | POA: Diagnosis not present

## 2023-10-03 DIAGNOSIS — N2581 Secondary hyperparathyroidism of renal origin: Secondary | ICD-10-CM | POA: Diagnosis not present

## 2023-10-03 DIAGNOSIS — Z23 Encounter for immunization: Secondary | ICD-10-CM | POA: Diagnosis not present

## 2023-10-03 DIAGNOSIS — Z992 Dependence on renal dialysis: Secondary | ICD-10-CM | POA: Diagnosis not present

## 2023-10-06 DIAGNOSIS — N2581 Secondary hyperparathyroidism of renal origin: Secondary | ICD-10-CM | POA: Diagnosis not present

## 2023-10-06 DIAGNOSIS — Z23 Encounter for immunization: Secondary | ICD-10-CM | POA: Diagnosis not present

## 2023-10-06 DIAGNOSIS — N186 End stage renal disease: Secondary | ICD-10-CM | POA: Diagnosis not present

## 2023-10-06 DIAGNOSIS — Z992 Dependence on renal dialysis: Secondary | ICD-10-CM | POA: Diagnosis not present

## 2023-10-09 DIAGNOSIS — Z992 Dependence on renal dialysis: Secondary | ICD-10-CM | POA: Diagnosis not present

## 2023-10-09 DIAGNOSIS — Z23 Encounter for immunization: Secondary | ICD-10-CM | POA: Diagnosis not present

## 2023-10-09 DIAGNOSIS — N186 End stage renal disease: Secondary | ICD-10-CM | POA: Diagnosis not present

## 2023-10-09 DIAGNOSIS — N2581 Secondary hyperparathyroidism of renal origin: Secondary | ICD-10-CM | POA: Diagnosis not present

## 2023-10-10 DIAGNOSIS — N2581 Secondary hyperparathyroidism of renal origin: Secondary | ICD-10-CM | POA: Diagnosis not present

## 2023-10-10 DIAGNOSIS — N186 End stage renal disease: Secondary | ICD-10-CM | POA: Diagnosis not present

## 2023-10-10 DIAGNOSIS — Z992 Dependence on renal dialysis: Secondary | ICD-10-CM | POA: Diagnosis not present

## 2023-10-13 DIAGNOSIS — Z992 Dependence on renal dialysis: Secondary | ICD-10-CM | POA: Diagnosis not present

## 2023-10-13 DIAGNOSIS — N186 End stage renal disease: Secondary | ICD-10-CM | POA: Diagnosis not present

## 2023-10-13 DIAGNOSIS — N2581 Secondary hyperparathyroidism of renal origin: Secondary | ICD-10-CM | POA: Diagnosis not present

## 2023-10-15 DIAGNOSIS — N2581 Secondary hyperparathyroidism of renal origin: Secondary | ICD-10-CM | POA: Diagnosis not present

## 2023-10-15 DIAGNOSIS — N186 End stage renal disease: Secondary | ICD-10-CM | POA: Diagnosis not present

## 2023-10-15 DIAGNOSIS — Z992 Dependence on renal dialysis: Secondary | ICD-10-CM | POA: Diagnosis not present

## 2023-10-17 DIAGNOSIS — N2581 Secondary hyperparathyroidism of renal origin: Secondary | ICD-10-CM | POA: Diagnosis not present

## 2023-10-17 DIAGNOSIS — N186 End stage renal disease: Secondary | ICD-10-CM | POA: Diagnosis not present

## 2023-10-17 DIAGNOSIS — Z992 Dependence on renal dialysis: Secondary | ICD-10-CM | POA: Diagnosis not present

## 2023-10-20 DIAGNOSIS — Z992 Dependence on renal dialysis: Secondary | ICD-10-CM | POA: Diagnosis not present

## 2023-10-20 DIAGNOSIS — N186 End stage renal disease: Secondary | ICD-10-CM | POA: Diagnosis not present

## 2023-10-20 DIAGNOSIS — N2581 Secondary hyperparathyroidism of renal origin: Secondary | ICD-10-CM | POA: Diagnosis not present

## 2023-10-22 DIAGNOSIS — N2581 Secondary hyperparathyroidism of renal origin: Secondary | ICD-10-CM | POA: Diagnosis not present

## 2023-10-22 DIAGNOSIS — N186 End stage renal disease: Secondary | ICD-10-CM | POA: Diagnosis not present

## 2023-10-22 DIAGNOSIS — Z992 Dependence on renal dialysis: Secondary | ICD-10-CM | POA: Diagnosis not present

## 2023-10-24 DIAGNOSIS — N2581 Secondary hyperparathyroidism of renal origin: Secondary | ICD-10-CM | POA: Diagnosis not present

## 2023-10-24 DIAGNOSIS — Z992 Dependence on renal dialysis: Secondary | ICD-10-CM | POA: Diagnosis not present

## 2023-10-24 DIAGNOSIS — N186 End stage renal disease: Secondary | ICD-10-CM | POA: Diagnosis not present

## 2023-10-27 DIAGNOSIS — N2581 Secondary hyperparathyroidism of renal origin: Secondary | ICD-10-CM | POA: Diagnosis not present

## 2023-10-27 DIAGNOSIS — N186 End stage renal disease: Secondary | ICD-10-CM | POA: Diagnosis not present

## 2023-10-27 DIAGNOSIS — Z992 Dependence on renal dialysis: Secondary | ICD-10-CM | POA: Diagnosis not present

## 2023-10-29 DIAGNOSIS — N186 End stage renal disease: Secondary | ICD-10-CM | POA: Diagnosis not present

## 2023-10-29 DIAGNOSIS — N2581 Secondary hyperparathyroidism of renal origin: Secondary | ICD-10-CM | POA: Diagnosis not present

## 2023-10-29 DIAGNOSIS — Z992 Dependence on renal dialysis: Secondary | ICD-10-CM | POA: Diagnosis not present

## 2023-10-31 DIAGNOSIS — N2581 Secondary hyperparathyroidism of renal origin: Secondary | ICD-10-CM | POA: Diagnosis not present

## 2023-10-31 DIAGNOSIS — N186 End stage renal disease: Secondary | ICD-10-CM | POA: Diagnosis not present

## 2023-10-31 DIAGNOSIS — Z992 Dependence on renal dialysis: Secondary | ICD-10-CM | POA: Diagnosis not present

## 2023-11-03 DIAGNOSIS — N2581 Secondary hyperparathyroidism of renal origin: Secondary | ICD-10-CM | POA: Diagnosis not present

## 2023-11-03 DIAGNOSIS — Z992 Dependence on renal dialysis: Secondary | ICD-10-CM | POA: Diagnosis not present

## 2023-11-03 DIAGNOSIS — N186 End stage renal disease: Secondary | ICD-10-CM | POA: Diagnosis not present

## 2023-11-05 DIAGNOSIS — N2581 Secondary hyperparathyroidism of renal origin: Secondary | ICD-10-CM | POA: Diagnosis not present

## 2023-11-05 DIAGNOSIS — N186 End stage renal disease: Secondary | ICD-10-CM | POA: Diagnosis not present

## 2023-11-05 DIAGNOSIS — Z992 Dependence on renal dialysis: Secondary | ICD-10-CM | POA: Diagnosis not present

## 2023-11-08 DIAGNOSIS — Z992 Dependence on renal dialysis: Secondary | ICD-10-CM | POA: Diagnosis not present

## 2023-11-08 DIAGNOSIS — N186 End stage renal disease: Secondary | ICD-10-CM | POA: Diagnosis not present

## 2023-11-10 DIAGNOSIS — Z992 Dependence on renal dialysis: Secondary | ICD-10-CM | POA: Diagnosis not present

## 2023-11-10 DIAGNOSIS — N2581 Secondary hyperparathyroidism of renal origin: Secondary | ICD-10-CM | POA: Diagnosis not present

## 2023-11-10 DIAGNOSIS — N186 End stage renal disease: Secondary | ICD-10-CM | POA: Diagnosis not present

## 2023-11-12 DIAGNOSIS — N2581 Secondary hyperparathyroidism of renal origin: Secondary | ICD-10-CM | POA: Diagnosis not present

## 2023-11-12 DIAGNOSIS — Z992 Dependence on renal dialysis: Secondary | ICD-10-CM | POA: Diagnosis not present

## 2023-11-12 DIAGNOSIS — N186 End stage renal disease: Secondary | ICD-10-CM | POA: Diagnosis not present

## 2023-11-14 DIAGNOSIS — Z992 Dependence on renal dialysis: Secondary | ICD-10-CM | POA: Diagnosis not present

## 2023-11-14 DIAGNOSIS — N186 End stage renal disease: Secondary | ICD-10-CM | POA: Diagnosis not present

## 2023-11-14 DIAGNOSIS — N2581 Secondary hyperparathyroidism of renal origin: Secondary | ICD-10-CM | POA: Diagnosis not present

## 2023-11-17 DIAGNOSIS — Z992 Dependence on renal dialysis: Secondary | ICD-10-CM | POA: Diagnosis not present

## 2023-11-17 DIAGNOSIS — N2581 Secondary hyperparathyroidism of renal origin: Secondary | ICD-10-CM | POA: Diagnosis not present

## 2023-11-17 DIAGNOSIS — N186 End stage renal disease: Secondary | ICD-10-CM | POA: Diagnosis not present

## 2023-11-19 DIAGNOSIS — N2581 Secondary hyperparathyroidism of renal origin: Secondary | ICD-10-CM | POA: Diagnosis not present

## 2023-11-19 DIAGNOSIS — Z992 Dependence on renal dialysis: Secondary | ICD-10-CM | POA: Diagnosis not present

## 2023-11-19 DIAGNOSIS — N186 End stage renal disease: Secondary | ICD-10-CM | POA: Diagnosis not present

## 2023-11-21 DIAGNOSIS — N2581 Secondary hyperparathyroidism of renal origin: Secondary | ICD-10-CM | POA: Diagnosis not present

## 2023-11-21 DIAGNOSIS — N186 End stage renal disease: Secondary | ICD-10-CM | POA: Diagnosis not present

## 2023-11-21 DIAGNOSIS — Z992 Dependence on renal dialysis: Secondary | ICD-10-CM | POA: Diagnosis not present

## 2023-11-24 DIAGNOSIS — Z992 Dependence on renal dialysis: Secondary | ICD-10-CM | POA: Diagnosis not present

## 2023-11-24 DIAGNOSIS — N186 End stage renal disease: Secondary | ICD-10-CM | POA: Diagnosis not present

## 2023-11-24 DIAGNOSIS — N2581 Secondary hyperparathyroidism of renal origin: Secondary | ICD-10-CM | POA: Diagnosis not present

## 2023-11-28 DIAGNOSIS — N2581 Secondary hyperparathyroidism of renal origin: Secondary | ICD-10-CM | POA: Diagnosis not present

## 2023-11-28 DIAGNOSIS — N186 End stage renal disease: Secondary | ICD-10-CM | POA: Diagnosis not present

## 2023-11-28 DIAGNOSIS — Z992 Dependence on renal dialysis: Secondary | ICD-10-CM | POA: Diagnosis not present

## 2023-12-01 DIAGNOSIS — N186 End stage renal disease: Secondary | ICD-10-CM | POA: Diagnosis not present

## 2023-12-01 DIAGNOSIS — Z992 Dependence on renal dialysis: Secondary | ICD-10-CM | POA: Diagnosis not present

## 2023-12-01 DIAGNOSIS — N2581 Secondary hyperparathyroidism of renal origin: Secondary | ICD-10-CM | POA: Diagnosis not present

## 2023-12-04 DIAGNOSIS — N186 End stage renal disease: Secondary | ICD-10-CM | POA: Diagnosis not present

## 2023-12-04 DIAGNOSIS — Z992 Dependence on renal dialysis: Secondary | ICD-10-CM | POA: Diagnosis not present

## 2023-12-04 DIAGNOSIS — N2581 Secondary hyperparathyroidism of renal origin: Secondary | ICD-10-CM | POA: Diagnosis not present

## 2023-12-08 DIAGNOSIS — Z992 Dependence on renal dialysis: Secondary | ICD-10-CM | POA: Diagnosis not present

## 2023-12-08 DIAGNOSIS — N186 End stage renal disease: Secondary | ICD-10-CM | POA: Diagnosis not present

## 2023-12-08 DIAGNOSIS — N2581 Secondary hyperparathyroidism of renal origin: Secondary | ICD-10-CM | POA: Diagnosis not present

## 2023-12-09 DIAGNOSIS — N186 End stage renal disease: Secondary | ICD-10-CM | POA: Diagnosis not present

## 2023-12-09 DIAGNOSIS — Z992 Dependence on renal dialysis: Secondary | ICD-10-CM | POA: Diagnosis not present

## 2023-12-10 DIAGNOSIS — N186 End stage renal disease: Secondary | ICD-10-CM | POA: Diagnosis not present

## 2023-12-10 DIAGNOSIS — N2581 Secondary hyperparathyroidism of renal origin: Secondary | ICD-10-CM | POA: Diagnosis not present

## 2023-12-10 DIAGNOSIS — Z992 Dependence on renal dialysis: Secondary | ICD-10-CM | POA: Diagnosis not present

## 2023-12-12 DIAGNOSIS — N186 End stage renal disease: Secondary | ICD-10-CM | POA: Diagnosis not present

## 2023-12-12 DIAGNOSIS — Z992 Dependence on renal dialysis: Secondary | ICD-10-CM | POA: Diagnosis not present

## 2023-12-12 DIAGNOSIS — N2581 Secondary hyperparathyroidism of renal origin: Secondary | ICD-10-CM | POA: Diagnosis not present

## 2023-12-15 DIAGNOSIS — Z992 Dependence on renal dialysis: Secondary | ICD-10-CM | POA: Diagnosis not present

## 2023-12-15 DIAGNOSIS — N2581 Secondary hyperparathyroidism of renal origin: Secondary | ICD-10-CM | POA: Diagnosis not present

## 2023-12-15 DIAGNOSIS — N186 End stage renal disease: Secondary | ICD-10-CM | POA: Diagnosis not present

## 2023-12-17 DIAGNOSIS — N2581 Secondary hyperparathyroidism of renal origin: Secondary | ICD-10-CM | POA: Diagnosis not present

## 2023-12-17 DIAGNOSIS — Z992 Dependence on renal dialysis: Secondary | ICD-10-CM | POA: Diagnosis not present

## 2023-12-17 DIAGNOSIS — N186 End stage renal disease: Secondary | ICD-10-CM | POA: Diagnosis not present

## 2023-12-19 DIAGNOSIS — N2581 Secondary hyperparathyroidism of renal origin: Secondary | ICD-10-CM | POA: Diagnosis not present

## 2023-12-19 DIAGNOSIS — N186 End stage renal disease: Secondary | ICD-10-CM | POA: Diagnosis not present

## 2023-12-19 DIAGNOSIS — Z992 Dependence on renal dialysis: Secondary | ICD-10-CM | POA: Diagnosis not present

## 2023-12-22 DIAGNOSIS — Z992 Dependence on renal dialysis: Secondary | ICD-10-CM | POA: Diagnosis not present

## 2023-12-22 DIAGNOSIS — N186 End stage renal disease: Secondary | ICD-10-CM | POA: Diagnosis not present

## 2023-12-22 DIAGNOSIS — N2581 Secondary hyperparathyroidism of renal origin: Secondary | ICD-10-CM | POA: Diagnosis not present

## 2023-12-24 DIAGNOSIS — N186 End stage renal disease: Secondary | ICD-10-CM | POA: Diagnosis not present

## 2023-12-24 DIAGNOSIS — N2581 Secondary hyperparathyroidism of renal origin: Secondary | ICD-10-CM | POA: Diagnosis not present

## 2023-12-24 DIAGNOSIS — Z992 Dependence on renal dialysis: Secondary | ICD-10-CM | POA: Diagnosis not present

## 2023-12-26 DIAGNOSIS — N2581 Secondary hyperparathyroidism of renal origin: Secondary | ICD-10-CM | POA: Diagnosis not present

## 2023-12-26 DIAGNOSIS — N186 End stage renal disease: Secondary | ICD-10-CM | POA: Diagnosis not present

## 2023-12-26 DIAGNOSIS — Z992 Dependence on renal dialysis: Secondary | ICD-10-CM | POA: Diagnosis not present

## 2023-12-29 DIAGNOSIS — N186 End stage renal disease: Secondary | ICD-10-CM | POA: Diagnosis not present

## 2023-12-29 DIAGNOSIS — Z992 Dependence on renal dialysis: Secondary | ICD-10-CM | POA: Diagnosis not present

## 2023-12-29 DIAGNOSIS — N2581 Secondary hyperparathyroidism of renal origin: Secondary | ICD-10-CM | POA: Diagnosis not present

## 2023-12-31 DIAGNOSIS — N2581 Secondary hyperparathyroidism of renal origin: Secondary | ICD-10-CM | POA: Diagnosis not present

## 2023-12-31 DIAGNOSIS — Z992 Dependence on renal dialysis: Secondary | ICD-10-CM | POA: Diagnosis not present

## 2023-12-31 DIAGNOSIS — N186 End stage renal disease: Secondary | ICD-10-CM | POA: Diagnosis not present

## 2024-01-02 DIAGNOSIS — N186 End stage renal disease: Secondary | ICD-10-CM | POA: Diagnosis not present

## 2024-01-02 DIAGNOSIS — N2581 Secondary hyperparathyroidism of renal origin: Secondary | ICD-10-CM | POA: Diagnosis not present

## 2024-01-02 DIAGNOSIS — Z992 Dependence on renal dialysis: Secondary | ICD-10-CM | POA: Diagnosis not present

## 2024-01-05 DIAGNOSIS — N186 End stage renal disease: Secondary | ICD-10-CM | POA: Diagnosis not present

## 2024-01-05 DIAGNOSIS — N2581 Secondary hyperparathyroidism of renal origin: Secondary | ICD-10-CM | POA: Diagnosis not present

## 2024-01-05 DIAGNOSIS — Z992 Dependence on renal dialysis: Secondary | ICD-10-CM | POA: Diagnosis not present

## 2024-01-06 DIAGNOSIS — N186 End stage renal disease: Secondary | ICD-10-CM | POA: Diagnosis not present

## 2024-01-06 DIAGNOSIS — B372 Candidiasis of skin and nail: Secondary | ICD-10-CM | POA: Diagnosis not present

## 2024-01-06 DIAGNOSIS — M62830 Muscle spasm of back: Secondary | ICD-10-CM | POA: Diagnosis not present

## 2024-01-06 DIAGNOSIS — Z0000 Encounter for general adult medical examination without abnormal findings: Secondary | ICD-10-CM | POA: Diagnosis not present

## 2024-01-06 DIAGNOSIS — I10 Essential (primary) hypertension: Secondary | ICD-10-CM | POA: Diagnosis not present

## 2024-01-06 DIAGNOSIS — K219 Gastro-esophageal reflux disease without esophagitis: Secondary | ICD-10-CM | POA: Diagnosis not present

## 2024-01-06 DIAGNOSIS — E213 Hyperparathyroidism, unspecified: Secondary | ICD-10-CM | POA: Diagnosis not present

## 2024-01-07 DIAGNOSIS — N186 End stage renal disease: Secondary | ICD-10-CM | POA: Diagnosis not present

## 2024-01-07 DIAGNOSIS — Z992 Dependence on renal dialysis: Secondary | ICD-10-CM | POA: Diagnosis not present

## 2024-01-07 DIAGNOSIS — N2581 Secondary hyperparathyroidism of renal origin: Secondary | ICD-10-CM | POA: Diagnosis not present

## 2024-01-09 DIAGNOSIS — N186 End stage renal disease: Secondary | ICD-10-CM | POA: Diagnosis not present

## 2024-01-09 DIAGNOSIS — Z992 Dependence on renal dialysis: Secondary | ICD-10-CM | POA: Diagnosis not present

## 2024-01-09 DIAGNOSIS — N2581 Secondary hyperparathyroidism of renal origin: Secondary | ICD-10-CM | POA: Diagnosis not present

## 2024-01-12 DIAGNOSIS — Z992 Dependence on renal dialysis: Secondary | ICD-10-CM | POA: Diagnosis not present

## 2024-01-12 DIAGNOSIS — N186 End stage renal disease: Secondary | ICD-10-CM | POA: Diagnosis not present

## 2024-01-15 DIAGNOSIS — N186 End stage renal disease: Secondary | ICD-10-CM | POA: Diagnosis not present

## 2024-01-15 DIAGNOSIS — Z992 Dependence on renal dialysis: Secondary | ICD-10-CM | POA: Diagnosis not present

## 2024-01-16 DIAGNOSIS — N186 End stage renal disease: Secondary | ICD-10-CM | POA: Diagnosis not present

## 2024-01-16 DIAGNOSIS — Z992 Dependence on renal dialysis: Secondary | ICD-10-CM | POA: Diagnosis not present

## 2024-01-19 DIAGNOSIS — N186 End stage renal disease: Secondary | ICD-10-CM | POA: Diagnosis not present

## 2024-01-19 DIAGNOSIS — Z992 Dependence on renal dialysis: Secondary | ICD-10-CM | POA: Diagnosis not present

## 2024-01-21 DIAGNOSIS — Z992 Dependence on renal dialysis: Secondary | ICD-10-CM | POA: Diagnosis not present

## 2024-01-21 DIAGNOSIS — N186 End stage renal disease: Secondary | ICD-10-CM | POA: Diagnosis not present

## 2024-01-22 DIAGNOSIS — Z992 Dependence on renal dialysis: Secondary | ICD-10-CM | POA: Diagnosis not present

## 2024-01-22 DIAGNOSIS — N186 End stage renal disease: Secondary | ICD-10-CM | POA: Diagnosis not present

## 2024-01-23 DIAGNOSIS — Z992 Dependence on renal dialysis: Secondary | ICD-10-CM | POA: Diagnosis not present

## 2024-01-23 DIAGNOSIS — N186 End stage renal disease: Secondary | ICD-10-CM | POA: Diagnosis not present

## 2024-01-26 DIAGNOSIS — N186 End stage renal disease: Secondary | ICD-10-CM | POA: Diagnosis not present

## 2024-01-26 DIAGNOSIS — Z992 Dependence on renal dialysis: Secondary | ICD-10-CM | POA: Diagnosis not present

## 2024-01-28 DIAGNOSIS — Z992 Dependence on renal dialysis: Secondary | ICD-10-CM | POA: Diagnosis not present

## 2024-01-28 DIAGNOSIS — N186 End stage renal disease: Secondary | ICD-10-CM | POA: Diagnosis not present

## 2024-01-30 DIAGNOSIS — N186 End stage renal disease: Secondary | ICD-10-CM | POA: Diagnosis not present

## 2024-01-30 DIAGNOSIS — Z992 Dependence on renal dialysis: Secondary | ICD-10-CM | POA: Diagnosis not present

## 2024-02-03 DIAGNOSIS — N186 End stage renal disease: Secondary | ICD-10-CM | POA: Diagnosis not present

## 2024-02-03 DIAGNOSIS — Z992 Dependence on renal dialysis: Secondary | ICD-10-CM | POA: Diagnosis not present

## 2024-02-04 DIAGNOSIS — N186 End stage renal disease: Secondary | ICD-10-CM | POA: Diagnosis not present

## 2024-02-04 DIAGNOSIS — Z992 Dependence on renal dialysis: Secondary | ICD-10-CM | POA: Diagnosis not present

## 2024-02-06 DIAGNOSIS — N186 End stage renal disease: Secondary | ICD-10-CM | POA: Diagnosis not present

## 2024-02-06 DIAGNOSIS — Z992 Dependence on renal dialysis: Secondary | ICD-10-CM | POA: Diagnosis not present

## 2024-02-07 DIAGNOSIS — N186 End stage renal disease: Secondary | ICD-10-CM | POA: Diagnosis not present

## 2024-02-07 DIAGNOSIS — Z992 Dependence on renal dialysis: Secondary | ICD-10-CM | POA: Diagnosis not present

## 2024-02-09 DIAGNOSIS — N186 End stage renal disease: Secondary | ICD-10-CM | POA: Diagnosis not present

## 2024-02-09 DIAGNOSIS — Z992 Dependence on renal dialysis: Secondary | ICD-10-CM | POA: Diagnosis not present

## 2024-02-13 DIAGNOSIS — N186 End stage renal disease: Secondary | ICD-10-CM | POA: Diagnosis not present

## 2024-02-13 DIAGNOSIS — Z992 Dependence on renal dialysis: Secondary | ICD-10-CM | POA: Diagnosis not present

## 2024-02-16 DIAGNOSIS — Z992 Dependence on renal dialysis: Secondary | ICD-10-CM | POA: Diagnosis not present

## 2024-02-16 DIAGNOSIS — N186 End stage renal disease: Secondary | ICD-10-CM | POA: Diagnosis not present

## 2024-02-18 DIAGNOSIS — Z992 Dependence on renal dialysis: Secondary | ICD-10-CM | POA: Diagnosis not present

## 2024-02-18 DIAGNOSIS — N186 End stage renal disease: Secondary | ICD-10-CM | POA: Diagnosis not present

## 2024-02-20 DIAGNOSIS — Z992 Dependence on renal dialysis: Secondary | ICD-10-CM | POA: Diagnosis not present

## 2024-02-20 DIAGNOSIS — N186 End stage renal disease: Secondary | ICD-10-CM | POA: Diagnosis not present

## 2024-02-21 DIAGNOSIS — N186 End stage renal disease: Secondary | ICD-10-CM | POA: Diagnosis not present

## 2024-02-21 DIAGNOSIS — Z992 Dependence on renal dialysis: Secondary | ICD-10-CM | POA: Diagnosis not present

## 2024-02-24 DIAGNOSIS — Z992 Dependence on renal dialysis: Secondary | ICD-10-CM | POA: Diagnosis not present

## 2024-02-24 DIAGNOSIS — N186 End stage renal disease: Secondary | ICD-10-CM | POA: Diagnosis not present

## 2024-02-25 DIAGNOSIS — N186 End stage renal disease: Secondary | ICD-10-CM | POA: Diagnosis not present

## 2024-02-25 DIAGNOSIS — Z992 Dependence on renal dialysis: Secondary | ICD-10-CM | POA: Diagnosis not present

## 2024-02-27 DIAGNOSIS — N186 End stage renal disease: Secondary | ICD-10-CM | POA: Diagnosis not present

## 2024-02-27 DIAGNOSIS — Z992 Dependence on renal dialysis: Secondary | ICD-10-CM | POA: Diagnosis not present

## 2024-03-01 DIAGNOSIS — N186 End stage renal disease: Secondary | ICD-10-CM | POA: Diagnosis not present

## 2024-03-01 DIAGNOSIS — Z992 Dependence on renal dialysis: Secondary | ICD-10-CM | POA: Diagnosis not present

## 2024-03-05 DIAGNOSIS — Z992 Dependence on renal dialysis: Secondary | ICD-10-CM | POA: Diagnosis not present

## 2024-03-05 DIAGNOSIS — N186 End stage renal disease: Secondary | ICD-10-CM | POA: Diagnosis not present

## 2024-03-08 DIAGNOSIS — N186 End stage renal disease: Secondary | ICD-10-CM | POA: Diagnosis not present

## 2024-03-08 DIAGNOSIS — Z992 Dependence on renal dialysis: Secondary | ICD-10-CM | POA: Diagnosis not present

## 2024-03-09 DIAGNOSIS — Z992 Dependence on renal dialysis: Secondary | ICD-10-CM | POA: Diagnosis not present

## 2024-03-09 DIAGNOSIS — N186 End stage renal disease: Secondary | ICD-10-CM | POA: Diagnosis not present

## 2024-03-09 DIAGNOSIS — N2581 Secondary hyperparathyroidism of renal origin: Secondary | ICD-10-CM | POA: Diagnosis not present

## 2024-03-12 DIAGNOSIS — N186 End stage renal disease: Secondary | ICD-10-CM | POA: Diagnosis not present

## 2024-03-12 DIAGNOSIS — N2581 Secondary hyperparathyroidism of renal origin: Secondary | ICD-10-CM | POA: Diagnosis not present

## 2024-03-12 DIAGNOSIS — Z992 Dependence on renal dialysis: Secondary | ICD-10-CM | POA: Diagnosis not present

## 2024-03-15 DIAGNOSIS — N186 End stage renal disease: Secondary | ICD-10-CM | POA: Diagnosis not present

## 2024-03-15 DIAGNOSIS — N2581 Secondary hyperparathyroidism of renal origin: Secondary | ICD-10-CM | POA: Diagnosis not present

## 2024-03-15 DIAGNOSIS — Z992 Dependence on renal dialysis: Secondary | ICD-10-CM | POA: Diagnosis not present

## 2024-03-17 DIAGNOSIS — Z992 Dependence on renal dialysis: Secondary | ICD-10-CM | POA: Diagnosis not present

## 2024-03-17 DIAGNOSIS — N2581 Secondary hyperparathyroidism of renal origin: Secondary | ICD-10-CM | POA: Diagnosis not present

## 2024-03-17 DIAGNOSIS — N186 End stage renal disease: Secondary | ICD-10-CM | POA: Diagnosis not present

## 2024-03-19 DIAGNOSIS — N186 End stage renal disease: Secondary | ICD-10-CM | POA: Diagnosis not present

## 2024-03-19 DIAGNOSIS — Z992 Dependence on renal dialysis: Secondary | ICD-10-CM | POA: Diagnosis not present

## 2024-03-19 DIAGNOSIS — N2581 Secondary hyperparathyroidism of renal origin: Secondary | ICD-10-CM | POA: Diagnosis not present

## 2024-03-24 DIAGNOSIS — N186 End stage renal disease: Secondary | ICD-10-CM | POA: Diagnosis not present

## 2024-03-24 DIAGNOSIS — Z992 Dependence on renal dialysis: Secondary | ICD-10-CM | POA: Diagnosis not present

## 2024-03-24 DIAGNOSIS — N2581 Secondary hyperparathyroidism of renal origin: Secondary | ICD-10-CM | POA: Diagnosis not present

## 2024-03-25 DIAGNOSIS — E875 Hyperkalemia: Secondary | ICD-10-CM | POA: Diagnosis not present

## 2024-03-26 DIAGNOSIS — N186 End stage renal disease: Secondary | ICD-10-CM | POA: Diagnosis not present

## 2024-03-26 DIAGNOSIS — N2581 Secondary hyperparathyroidism of renal origin: Secondary | ICD-10-CM | POA: Diagnosis not present

## 2024-03-26 DIAGNOSIS — Z992 Dependence on renal dialysis: Secondary | ICD-10-CM | POA: Diagnosis not present

## 2024-03-29 DIAGNOSIS — Z992 Dependence on renal dialysis: Secondary | ICD-10-CM | POA: Diagnosis not present

## 2024-03-29 DIAGNOSIS — N186 End stage renal disease: Secondary | ICD-10-CM | POA: Diagnosis not present

## 2024-03-29 DIAGNOSIS — N2581 Secondary hyperparathyroidism of renal origin: Secondary | ICD-10-CM | POA: Diagnosis not present

## 2024-03-31 DIAGNOSIS — N186 End stage renal disease: Secondary | ICD-10-CM | POA: Diagnosis not present

## 2024-03-31 DIAGNOSIS — Z992 Dependence on renal dialysis: Secondary | ICD-10-CM | POA: Diagnosis not present

## 2024-03-31 DIAGNOSIS — N2581 Secondary hyperparathyroidism of renal origin: Secondary | ICD-10-CM | POA: Diagnosis not present

## 2024-04-02 DIAGNOSIS — Z992 Dependence on renal dialysis: Secondary | ICD-10-CM | POA: Diagnosis not present

## 2024-04-02 DIAGNOSIS — N2581 Secondary hyperparathyroidism of renal origin: Secondary | ICD-10-CM | POA: Diagnosis not present

## 2024-04-02 DIAGNOSIS — N186 End stage renal disease: Secondary | ICD-10-CM | POA: Diagnosis not present

## 2024-04-07 DIAGNOSIS — J40 Bronchitis, not specified as acute or chronic: Secondary | ICD-10-CM | POA: Diagnosis not present

## 2024-04-07 DIAGNOSIS — N186 End stage renal disease: Secondary | ICD-10-CM | POA: Diagnosis not present

## 2024-04-07 DIAGNOSIS — K219 Gastro-esophageal reflux disease without esophagitis: Secondary | ICD-10-CM | POA: Diagnosis not present

## 2024-04-07 DIAGNOSIS — M62830 Muscle spasm of back: Secondary | ICD-10-CM | POA: Diagnosis not present

## 2024-04-07 DIAGNOSIS — Z0000 Encounter for general adult medical examination without abnormal findings: Secondary | ICD-10-CM | POA: Diagnosis not present

## 2024-04-07 DIAGNOSIS — I10 Essential (primary) hypertension: Secondary | ICD-10-CM | POA: Diagnosis not present

## 2024-04-07 DIAGNOSIS — E213 Hyperparathyroidism, unspecified: Secondary | ICD-10-CM | POA: Diagnosis not present

## 2024-04-07 DIAGNOSIS — N2581 Secondary hyperparathyroidism of renal origin: Secondary | ICD-10-CM | POA: Diagnosis not present

## 2024-04-07 DIAGNOSIS — Z992 Dependence on renal dialysis: Secondary | ICD-10-CM | POA: Diagnosis not present

## 2024-04-10 DIAGNOSIS — N2581 Secondary hyperparathyroidism of renal origin: Secondary | ICD-10-CM | POA: Diagnosis not present

## 2024-04-10 DIAGNOSIS — Z992 Dependence on renal dialysis: Secondary | ICD-10-CM | POA: Diagnosis not present

## 2024-04-10 DIAGNOSIS — N186 End stage renal disease: Secondary | ICD-10-CM | POA: Diagnosis not present

## 2024-04-12 DIAGNOSIS — N2581 Secondary hyperparathyroidism of renal origin: Secondary | ICD-10-CM | POA: Diagnosis not present

## 2024-04-12 DIAGNOSIS — Z992 Dependence on renal dialysis: Secondary | ICD-10-CM | POA: Diagnosis not present

## 2024-04-12 DIAGNOSIS — N186 End stage renal disease: Secondary | ICD-10-CM | POA: Diagnosis not present

## 2024-04-14 DIAGNOSIS — Z992 Dependence on renal dialysis: Secondary | ICD-10-CM | POA: Diagnosis not present

## 2024-04-14 DIAGNOSIS — N2581 Secondary hyperparathyroidism of renal origin: Secondary | ICD-10-CM | POA: Diagnosis not present

## 2024-04-14 DIAGNOSIS — N186 End stage renal disease: Secondary | ICD-10-CM | POA: Diagnosis not present

## 2024-04-16 DIAGNOSIS — N2581 Secondary hyperparathyroidism of renal origin: Secondary | ICD-10-CM | POA: Diagnosis not present

## 2024-04-16 DIAGNOSIS — Z992 Dependence on renal dialysis: Secondary | ICD-10-CM | POA: Diagnosis not present

## 2024-04-16 DIAGNOSIS — N186 End stage renal disease: Secondary | ICD-10-CM | POA: Diagnosis not present

## 2024-04-19 DIAGNOSIS — N2581 Secondary hyperparathyroidism of renal origin: Secondary | ICD-10-CM | POA: Diagnosis not present

## 2024-04-19 DIAGNOSIS — Z992 Dependence on renal dialysis: Secondary | ICD-10-CM | POA: Diagnosis not present

## 2024-04-19 DIAGNOSIS — N186 End stage renal disease: Secondary | ICD-10-CM | POA: Diagnosis not present

## 2024-04-20 ENCOUNTER — Encounter (HOSPITAL_COMMUNITY)
Admission: RE | Disposition: A | Discharge status: Home / Self Care | Payer: Self-pay | Source: Home / Self Care | Attending: Nephrology

## 2024-04-20 ENCOUNTER — Ambulatory Visit (HOSPITAL_COMMUNITY)
Admission: RE | Admit: 2024-04-20 | Discharge: 2024-04-20 | Disposition: A | Discharge status: Home / Self Care | Attending: Nephrology | Admitting: Nephrology

## 2024-04-20 SURGERY — A/V SHUNT INTERVENTION
Anesthesia: LOCAL | Laterality: Right

## 2024-04-20 NOTE — H&P (Signed)
 Chief Complaint: Numbness/paresthesia of right hand-evaluate for vascular steal HPI:  48 year old man with past medical history significant for hypertension, dyslipidemia, anxiety/depression, gout and end-stage renal disease on hemodialysis.  He has a right brachiocephalic fistula that was placed in 2023 by Dr. Shaunna Delaware.  He is referred for recent emergence of intermittent numbness/tingling of the medial 3 fingers of his right hand ipsilateral to the fistula.  He does not have any discoloration of the hand/fingertips or development of any ulcerations.  He does not have any resting pain or claudication.  Symptoms resolved with shaking his hand and occasionally get worse during dialysis.  He is questioning the need for the procedure today and whether this will solve the problem or simply diagnose it.  Physical exam: Well-developed right brachiocephalic fistula with aneurysmal cannulation segment that is nonpulsatile.  Collapses partially with arm elevation and augments optimally.  The right hand has 2+ radial pulse symmetrical to the left hand with 1+ ulnar pulse.  There is no ulceration or discoloration of the fingertips.  There is no muscular wasting noted over the thenar or hypothenar areas when both hands are compared.  No evidence of critical limb ischemia.  Assessment/Plan 1.  Numbness/tingling of right hand: This appears to be primarily neurogenic rather than vascular.  I recommended for the patient to wear a glove in his right hand during dialysis and support his elbow on a pillow.  If symptoms persist, refer to vascular surgery for evaluation of vascular steal/banding procedure.  Melodie Spry, MD 04/20/2024, 10:22 AM

## 2024-04-21 DIAGNOSIS — N2581 Secondary hyperparathyroidism of renal origin: Secondary | ICD-10-CM | POA: Diagnosis not present

## 2024-04-21 DIAGNOSIS — N186 End stage renal disease: Secondary | ICD-10-CM | POA: Diagnosis not present

## 2024-04-21 DIAGNOSIS — Z992 Dependence on renal dialysis: Secondary | ICD-10-CM | POA: Diagnosis not present

## 2024-04-23 DIAGNOSIS — N186 End stage renal disease: Secondary | ICD-10-CM | POA: Diagnosis not present

## 2024-04-23 DIAGNOSIS — Z992 Dependence on renal dialysis: Secondary | ICD-10-CM | POA: Diagnosis not present

## 2024-04-23 DIAGNOSIS — N2581 Secondary hyperparathyroidism of renal origin: Secondary | ICD-10-CM | POA: Diagnosis not present

## 2024-04-26 DIAGNOSIS — N186 End stage renal disease: Secondary | ICD-10-CM | POA: Diagnosis not present

## 2024-04-26 DIAGNOSIS — N2581 Secondary hyperparathyroidism of renal origin: Secondary | ICD-10-CM | POA: Diagnosis not present

## 2024-04-26 DIAGNOSIS — Z992 Dependence on renal dialysis: Secondary | ICD-10-CM | POA: Diagnosis not present

## 2024-04-28 DIAGNOSIS — N186 End stage renal disease: Secondary | ICD-10-CM | POA: Diagnosis not present

## 2024-04-28 DIAGNOSIS — N2581 Secondary hyperparathyroidism of renal origin: Secondary | ICD-10-CM | POA: Diagnosis not present

## 2024-04-28 DIAGNOSIS — Z992 Dependence on renal dialysis: Secondary | ICD-10-CM | POA: Diagnosis not present

## 2024-04-30 DIAGNOSIS — N186 End stage renal disease: Secondary | ICD-10-CM | POA: Diagnosis not present

## 2024-04-30 DIAGNOSIS — Z992 Dependence on renal dialysis: Secondary | ICD-10-CM | POA: Diagnosis not present

## 2024-04-30 DIAGNOSIS — N2581 Secondary hyperparathyroidism of renal origin: Secondary | ICD-10-CM | POA: Diagnosis not present

## 2024-05-03 DIAGNOSIS — Z992 Dependence on renal dialysis: Secondary | ICD-10-CM | POA: Diagnosis not present

## 2024-05-03 DIAGNOSIS — N2581 Secondary hyperparathyroidism of renal origin: Secondary | ICD-10-CM | POA: Diagnosis not present

## 2024-05-03 DIAGNOSIS — N186 End stage renal disease: Secondary | ICD-10-CM | POA: Diagnosis not present

## 2024-05-05 DIAGNOSIS — Z992 Dependence on renal dialysis: Secondary | ICD-10-CM | POA: Diagnosis not present

## 2024-05-05 DIAGNOSIS — N186 End stage renal disease: Secondary | ICD-10-CM | POA: Diagnosis not present

## 2024-05-05 DIAGNOSIS — N2581 Secondary hyperparathyroidism of renal origin: Secondary | ICD-10-CM | POA: Diagnosis not present

## 2024-05-07 DIAGNOSIS — N2581 Secondary hyperparathyroidism of renal origin: Secondary | ICD-10-CM | POA: Diagnosis not present

## 2024-05-07 DIAGNOSIS — N186 End stage renal disease: Secondary | ICD-10-CM | POA: Diagnosis not present

## 2024-05-07 DIAGNOSIS — Z992 Dependence on renal dialysis: Secondary | ICD-10-CM | POA: Diagnosis not present

## 2024-05-08 DIAGNOSIS — Z992 Dependence on renal dialysis: Secondary | ICD-10-CM | POA: Diagnosis not present

## 2024-05-08 DIAGNOSIS — N186 End stage renal disease: Secondary | ICD-10-CM | POA: Diagnosis not present

## 2024-05-09 DIAGNOSIS — Z992 Dependence on renal dialysis: Secondary | ICD-10-CM | POA: Diagnosis not present

## 2024-05-09 DIAGNOSIS — N2581 Secondary hyperparathyroidism of renal origin: Secondary | ICD-10-CM | POA: Diagnosis not present

## 2024-05-09 DIAGNOSIS — N186 End stage renal disease: Secondary | ICD-10-CM | POA: Diagnosis not present

## 2024-05-10 DIAGNOSIS — N2581 Secondary hyperparathyroidism of renal origin: Secondary | ICD-10-CM | POA: Diagnosis not present

## 2024-05-10 DIAGNOSIS — N186 End stage renal disease: Secondary | ICD-10-CM | POA: Diagnosis not present

## 2024-05-10 DIAGNOSIS — Z992 Dependence on renal dialysis: Secondary | ICD-10-CM | POA: Diagnosis not present

## 2024-05-12 DIAGNOSIS — N186 End stage renal disease: Secondary | ICD-10-CM | POA: Diagnosis not present

## 2024-05-12 DIAGNOSIS — Z992 Dependence on renal dialysis: Secondary | ICD-10-CM | POA: Diagnosis not present

## 2024-05-12 DIAGNOSIS — N2581 Secondary hyperparathyroidism of renal origin: Secondary | ICD-10-CM | POA: Diagnosis not present

## 2024-05-14 DIAGNOSIS — N186 End stage renal disease: Secondary | ICD-10-CM | POA: Diagnosis not present

## 2024-05-14 DIAGNOSIS — Z992 Dependence on renal dialysis: Secondary | ICD-10-CM | POA: Diagnosis not present

## 2024-05-14 DIAGNOSIS — N2581 Secondary hyperparathyroidism of renal origin: Secondary | ICD-10-CM | POA: Diagnosis not present

## 2024-05-17 DIAGNOSIS — N186 End stage renal disease: Secondary | ICD-10-CM | POA: Diagnosis not present

## 2024-05-17 DIAGNOSIS — Z992 Dependence on renal dialysis: Secondary | ICD-10-CM | POA: Diagnosis not present

## 2024-05-17 DIAGNOSIS — N2581 Secondary hyperparathyroidism of renal origin: Secondary | ICD-10-CM | POA: Diagnosis not present

## 2024-05-19 DIAGNOSIS — N2581 Secondary hyperparathyroidism of renal origin: Secondary | ICD-10-CM | POA: Diagnosis not present

## 2024-05-19 DIAGNOSIS — Z992 Dependence on renal dialysis: Secondary | ICD-10-CM | POA: Diagnosis not present

## 2024-05-19 DIAGNOSIS — N186 End stage renal disease: Secondary | ICD-10-CM | POA: Diagnosis not present

## 2024-05-21 DIAGNOSIS — N186 End stage renal disease: Secondary | ICD-10-CM | POA: Diagnosis not present

## 2024-05-21 DIAGNOSIS — Z992 Dependence on renal dialysis: Secondary | ICD-10-CM | POA: Diagnosis not present

## 2024-05-21 DIAGNOSIS — N2581 Secondary hyperparathyroidism of renal origin: Secondary | ICD-10-CM | POA: Diagnosis not present

## 2024-05-24 DIAGNOSIS — N186 End stage renal disease: Secondary | ICD-10-CM | POA: Diagnosis not present

## 2024-05-24 DIAGNOSIS — N2581 Secondary hyperparathyroidism of renal origin: Secondary | ICD-10-CM | POA: Diagnosis not present

## 2024-05-24 DIAGNOSIS — Z992 Dependence on renal dialysis: Secondary | ICD-10-CM | POA: Diagnosis not present

## 2024-05-26 DIAGNOSIS — N2581 Secondary hyperparathyroidism of renal origin: Secondary | ICD-10-CM | POA: Diagnosis not present

## 2024-05-26 DIAGNOSIS — N186 End stage renal disease: Secondary | ICD-10-CM | POA: Diagnosis not present

## 2024-05-26 DIAGNOSIS — Z992 Dependence on renal dialysis: Secondary | ICD-10-CM | POA: Diagnosis not present

## 2024-05-28 DIAGNOSIS — Z992 Dependence on renal dialysis: Secondary | ICD-10-CM | POA: Diagnosis not present

## 2024-05-28 DIAGNOSIS — N186 End stage renal disease: Secondary | ICD-10-CM | POA: Diagnosis not present

## 2024-05-28 DIAGNOSIS — N2581 Secondary hyperparathyroidism of renal origin: Secondary | ICD-10-CM | POA: Diagnosis not present

## 2024-05-31 DIAGNOSIS — N2581 Secondary hyperparathyroidism of renal origin: Secondary | ICD-10-CM | POA: Diagnosis not present

## 2024-05-31 DIAGNOSIS — N186 End stage renal disease: Secondary | ICD-10-CM | POA: Diagnosis not present

## 2024-05-31 DIAGNOSIS — Z992 Dependence on renal dialysis: Secondary | ICD-10-CM | POA: Diagnosis not present

## 2024-06-02 DIAGNOSIS — Z992 Dependence on renal dialysis: Secondary | ICD-10-CM | POA: Diagnosis not present

## 2024-06-02 DIAGNOSIS — N2581 Secondary hyperparathyroidism of renal origin: Secondary | ICD-10-CM | POA: Diagnosis not present

## 2024-06-02 DIAGNOSIS — N186 End stage renal disease: Secondary | ICD-10-CM | POA: Diagnosis not present

## 2024-06-04 DIAGNOSIS — N186 End stage renal disease: Secondary | ICD-10-CM | POA: Diagnosis not present

## 2024-06-04 DIAGNOSIS — Z992 Dependence on renal dialysis: Secondary | ICD-10-CM | POA: Diagnosis not present

## 2024-06-04 DIAGNOSIS — N2581 Secondary hyperparathyroidism of renal origin: Secondary | ICD-10-CM | POA: Diagnosis not present

## 2024-06-07 DIAGNOSIS — Z992 Dependence on renal dialysis: Secondary | ICD-10-CM | POA: Diagnosis not present

## 2024-06-07 DIAGNOSIS — N2581 Secondary hyperparathyroidism of renal origin: Secondary | ICD-10-CM | POA: Diagnosis not present

## 2024-06-07 DIAGNOSIS — N186 End stage renal disease: Secondary | ICD-10-CM | POA: Diagnosis not present

## 2024-06-08 DIAGNOSIS — N2581 Secondary hyperparathyroidism of renal origin: Secondary | ICD-10-CM | POA: Diagnosis not present

## 2024-06-08 DIAGNOSIS — N186 End stage renal disease: Secondary | ICD-10-CM | POA: Diagnosis not present

## 2024-06-08 DIAGNOSIS — Z992 Dependence on renal dialysis: Secondary | ICD-10-CM | POA: Diagnosis not present

## 2024-06-09 DIAGNOSIS — N186 End stage renal disease: Secondary | ICD-10-CM | POA: Diagnosis not present

## 2024-06-09 DIAGNOSIS — Z992 Dependence on renal dialysis: Secondary | ICD-10-CM | POA: Diagnosis not present

## 2024-06-09 DIAGNOSIS — N2581 Secondary hyperparathyroidism of renal origin: Secondary | ICD-10-CM | POA: Diagnosis not present

## 2024-06-11 DIAGNOSIS — Z992 Dependence on renal dialysis: Secondary | ICD-10-CM | POA: Diagnosis not present

## 2024-06-11 DIAGNOSIS — N2581 Secondary hyperparathyroidism of renal origin: Secondary | ICD-10-CM | POA: Diagnosis not present

## 2024-06-11 DIAGNOSIS — N186 End stage renal disease: Secondary | ICD-10-CM | POA: Diagnosis not present

## 2024-06-14 DIAGNOSIS — Z992 Dependence on renal dialysis: Secondary | ICD-10-CM | POA: Diagnosis not present

## 2024-06-14 DIAGNOSIS — N2581 Secondary hyperparathyroidism of renal origin: Secondary | ICD-10-CM | POA: Diagnosis not present

## 2024-06-14 DIAGNOSIS — N186 End stage renal disease: Secondary | ICD-10-CM | POA: Diagnosis not present

## 2024-06-16 DIAGNOSIS — I509 Heart failure, unspecified: Secondary | ICD-10-CM | POA: Diagnosis not present

## 2024-06-16 DIAGNOSIS — E785 Hyperlipidemia, unspecified: Secondary | ICD-10-CM | POA: Diagnosis not present

## 2024-06-16 DIAGNOSIS — Z992 Dependence on renal dialysis: Secondary | ICD-10-CM | POA: Diagnosis not present

## 2024-06-16 DIAGNOSIS — I120 Hypertensive chronic kidney disease with stage 5 chronic kidney disease or end stage renal disease: Secondary | ICD-10-CM | POA: Diagnosis not present

## 2024-06-16 DIAGNOSIS — Z720 Tobacco use: Secondary | ICD-10-CM | POA: Diagnosis not present

## 2024-06-16 DIAGNOSIS — N186 End stage renal disease: Secondary | ICD-10-CM | POA: Diagnosis not present

## 2024-06-16 DIAGNOSIS — Z7682 Awaiting organ transplant status: Secondary | ICD-10-CM | POA: Diagnosis not present

## 2024-06-16 DIAGNOSIS — D849 Immunodeficiency, unspecified: Secondary | ICD-10-CM | POA: Diagnosis not present

## 2024-06-16 DIAGNOSIS — I132 Hypertensive heart and chronic kidney disease with heart failure and with stage 5 chronic kidney disease, or end stage renal disease: Secondary | ICD-10-CM | POA: Diagnosis not present

## 2024-06-16 DIAGNOSIS — I272 Pulmonary hypertension, unspecified: Secondary | ICD-10-CM | POA: Diagnosis not present

## 2024-06-18 DIAGNOSIS — N2581 Secondary hyperparathyroidism of renal origin: Secondary | ICD-10-CM | POA: Diagnosis not present

## 2024-06-18 DIAGNOSIS — Z992 Dependence on renal dialysis: Secondary | ICD-10-CM | POA: Diagnosis not present

## 2024-06-18 DIAGNOSIS — N186 End stage renal disease: Secondary | ICD-10-CM | POA: Diagnosis not present

## 2024-06-21 DIAGNOSIS — N2581 Secondary hyperparathyroidism of renal origin: Secondary | ICD-10-CM | POA: Diagnosis not present

## 2024-06-21 DIAGNOSIS — Z992 Dependence on renal dialysis: Secondary | ICD-10-CM | POA: Diagnosis not present

## 2024-06-21 DIAGNOSIS — N186 End stage renal disease: Secondary | ICD-10-CM | POA: Diagnosis not present

## 2024-06-23 DIAGNOSIS — N186 End stage renal disease: Secondary | ICD-10-CM | POA: Diagnosis not present

## 2024-06-23 DIAGNOSIS — N2581 Secondary hyperparathyroidism of renal origin: Secondary | ICD-10-CM | POA: Diagnosis not present

## 2024-06-23 DIAGNOSIS — Z992 Dependence on renal dialysis: Secondary | ICD-10-CM | POA: Diagnosis not present

## 2024-06-25 DIAGNOSIS — N2581 Secondary hyperparathyroidism of renal origin: Secondary | ICD-10-CM | POA: Diagnosis not present

## 2024-06-25 DIAGNOSIS — Z992 Dependence on renal dialysis: Secondary | ICD-10-CM | POA: Diagnosis not present

## 2024-06-25 DIAGNOSIS — N186 End stage renal disease: Secondary | ICD-10-CM | POA: Diagnosis not present

## 2024-06-28 DIAGNOSIS — N2581 Secondary hyperparathyroidism of renal origin: Secondary | ICD-10-CM | POA: Diagnosis not present

## 2024-06-28 DIAGNOSIS — Z992 Dependence on renal dialysis: Secondary | ICD-10-CM | POA: Diagnosis not present

## 2024-06-28 DIAGNOSIS — N186 End stage renal disease: Secondary | ICD-10-CM | POA: Diagnosis not present

## 2024-06-30 DIAGNOSIS — Z992 Dependence on renal dialysis: Secondary | ICD-10-CM | POA: Diagnosis not present

## 2024-06-30 DIAGNOSIS — N2581 Secondary hyperparathyroidism of renal origin: Secondary | ICD-10-CM | POA: Diagnosis not present

## 2024-06-30 DIAGNOSIS — N186 End stage renal disease: Secondary | ICD-10-CM | POA: Diagnosis not present

## 2024-07-02 DIAGNOSIS — N186 End stage renal disease: Secondary | ICD-10-CM | POA: Diagnosis not present

## 2024-07-02 DIAGNOSIS — Z992 Dependence on renal dialysis: Secondary | ICD-10-CM | POA: Diagnosis not present

## 2024-07-02 DIAGNOSIS — N2581 Secondary hyperparathyroidism of renal origin: Secondary | ICD-10-CM | POA: Diagnosis not present

## 2024-07-04 DIAGNOSIS — N186 End stage renal disease: Secondary | ICD-10-CM | POA: Diagnosis not present

## 2024-07-04 DIAGNOSIS — Z992 Dependence on renal dialysis: Secondary | ICD-10-CM | POA: Diagnosis not present

## 2024-07-04 DIAGNOSIS — N2581 Secondary hyperparathyroidism of renal origin: Secondary | ICD-10-CM | POA: Diagnosis not present

## 2024-07-05 DIAGNOSIS — N186 End stage renal disease: Secondary | ICD-10-CM | POA: Diagnosis not present

## 2024-07-05 DIAGNOSIS — Z992 Dependence on renal dialysis: Secondary | ICD-10-CM | POA: Diagnosis not present

## 2024-07-05 DIAGNOSIS — N2581 Secondary hyperparathyroidism of renal origin: Secondary | ICD-10-CM | POA: Diagnosis not present

## 2024-07-08 DIAGNOSIS — N186 End stage renal disease: Secondary | ICD-10-CM | POA: Diagnosis not present

## 2024-07-08 DIAGNOSIS — Z992 Dependence on renal dialysis: Secondary | ICD-10-CM | POA: Diagnosis not present

## 2024-07-09 DIAGNOSIS — Z992 Dependence on renal dialysis: Secondary | ICD-10-CM | POA: Diagnosis not present

## 2024-07-09 DIAGNOSIS — N186 End stage renal disease: Secondary | ICD-10-CM | POA: Diagnosis not present

## 2024-07-09 DIAGNOSIS — N2581 Secondary hyperparathyroidism of renal origin: Secondary | ICD-10-CM | POA: Diagnosis not present

## 2024-07-12 DIAGNOSIS — Z992 Dependence on renal dialysis: Secondary | ICD-10-CM | POA: Diagnosis not present

## 2024-07-12 DIAGNOSIS — N2581 Secondary hyperparathyroidism of renal origin: Secondary | ICD-10-CM | POA: Diagnosis not present

## 2024-07-12 DIAGNOSIS — N186 End stage renal disease: Secondary | ICD-10-CM | POA: Diagnosis not present

## 2024-07-14 DIAGNOSIS — Z992 Dependence on renal dialysis: Secondary | ICD-10-CM | POA: Diagnosis not present

## 2024-07-14 DIAGNOSIS — N2581 Secondary hyperparathyroidism of renal origin: Secondary | ICD-10-CM | POA: Diagnosis not present

## 2024-07-14 DIAGNOSIS — N186 End stage renal disease: Secondary | ICD-10-CM | POA: Diagnosis not present

## 2024-07-16 DIAGNOSIS — N2581 Secondary hyperparathyroidism of renal origin: Secondary | ICD-10-CM | POA: Diagnosis not present

## 2024-07-16 DIAGNOSIS — N186 End stage renal disease: Secondary | ICD-10-CM | POA: Diagnosis not present

## 2024-07-16 DIAGNOSIS — Z992 Dependence on renal dialysis: Secondary | ICD-10-CM | POA: Diagnosis not present

## 2024-07-19 DIAGNOSIS — N2581 Secondary hyperparathyroidism of renal origin: Secondary | ICD-10-CM | POA: Diagnosis not present

## 2024-07-19 DIAGNOSIS — Z992 Dependence on renal dialysis: Secondary | ICD-10-CM | POA: Diagnosis not present

## 2024-07-19 DIAGNOSIS — N186 End stage renal disease: Secondary | ICD-10-CM | POA: Diagnosis not present

## 2024-07-21 DIAGNOSIS — N186 End stage renal disease: Secondary | ICD-10-CM | POA: Diagnosis not present

## 2024-07-21 DIAGNOSIS — Z992 Dependence on renal dialysis: Secondary | ICD-10-CM | POA: Diagnosis not present

## 2024-07-21 DIAGNOSIS — N2581 Secondary hyperparathyroidism of renal origin: Secondary | ICD-10-CM | POA: Diagnosis not present

## 2024-07-23 DIAGNOSIS — Z992 Dependence on renal dialysis: Secondary | ICD-10-CM | POA: Diagnosis not present

## 2024-07-23 DIAGNOSIS — N2581 Secondary hyperparathyroidism of renal origin: Secondary | ICD-10-CM | POA: Diagnosis not present

## 2024-07-23 DIAGNOSIS — N186 End stage renal disease: Secondary | ICD-10-CM | POA: Diagnosis not present

## 2024-07-26 DIAGNOSIS — Z992 Dependence on renal dialysis: Secondary | ICD-10-CM | POA: Diagnosis not present

## 2024-07-26 DIAGNOSIS — N186 End stage renal disease: Secondary | ICD-10-CM | POA: Diagnosis not present

## 2024-07-26 DIAGNOSIS — N2581 Secondary hyperparathyroidism of renal origin: Secondary | ICD-10-CM | POA: Diagnosis not present

## 2024-07-28 DIAGNOSIS — Z992 Dependence on renal dialysis: Secondary | ICD-10-CM | POA: Diagnosis not present

## 2024-07-28 DIAGNOSIS — N186 End stage renal disease: Secondary | ICD-10-CM | POA: Diagnosis not present

## 2024-07-28 DIAGNOSIS — N2581 Secondary hyperparathyroidism of renal origin: Secondary | ICD-10-CM | POA: Diagnosis not present

## 2024-08-02 DIAGNOSIS — N2581 Secondary hyperparathyroidism of renal origin: Secondary | ICD-10-CM | POA: Diagnosis not present

## 2024-08-02 DIAGNOSIS — Z992 Dependence on renal dialysis: Secondary | ICD-10-CM | POA: Diagnosis not present

## 2024-08-02 DIAGNOSIS — N186 End stage renal disease: Secondary | ICD-10-CM | POA: Diagnosis not present

## 2024-08-03 DIAGNOSIS — Z992 Dependence on renal dialysis: Secondary | ICD-10-CM | POA: Diagnosis not present

## 2024-08-03 DIAGNOSIS — N186 End stage renal disease: Secondary | ICD-10-CM | POA: Diagnosis not present

## 2024-08-03 DIAGNOSIS — N2581 Secondary hyperparathyroidism of renal origin: Secondary | ICD-10-CM | POA: Diagnosis not present

## 2024-08-04 DIAGNOSIS — Z992 Dependence on renal dialysis: Secondary | ICD-10-CM | POA: Diagnosis not present

## 2024-08-04 DIAGNOSIS — N186 End stage renal disease: Secondary | ICD-10-CM | POA: Diagnosis not present

## 2024-08-04 DIAGNOSIS — N2581 Secondary hyperparathyroidism of renal origin: Secondary | ICD-10-CM | POA: Diagnosis not present

## 2024-08-06 DIAGNOSIS — N2581 Secondary hyperparathyroidism of renal origin: Secondary | ICD-10-CM | POA: Diagnosis not present

## 2024-08-06 DIAGNOSIS — Z992 Dependence on renal dialysis: Secondary | ICD-10-CM | POA: Diagnosis not present

## 2024-08-06 DIAGNOSIS — N186 End stage renal disease: Secondary | ICD-10-CM | POA: Diagnosis not present

## 2024-08-08 DIAGNOSIS — Z992 Dependence on renal dialysis: Secondary | ICD-10-CM | POA: Diagnosis not present

## 2024-08-08 DIAGNOSIS — N186 End stage renal disease: Secondary | ICD-10-CM | POA: Diagnosis not present

## 2024-08-09 DIAGNOSIS — Z992 Dependence on renal dialysis: Secondary | ICD-10-CM | POA: Diagnosis not present

## 2024-08-09 DIAGNOSIS — N2581 Secondary hyperparathyroidism of renal origin: Secondary | ICD-10-CM | POA: Diagnosis not present

## 2024-08-09 DIAGNOSIS — N186 End stage renal disease: Secondary | ICD-10-CM | POA: Diagnosis not present

## 2024-08-11 DIAGNOSIS — N186 End stage renal disease: Secondary | ICD-10-CM | POA: Diagnosis not present

## 2024-08-11 DIAGNOSIS — N2581 Secondary hyperparathyroidism of renal origin: Secondary | ICD-10-CM | POA: Diagnosis not present

## 2024-08-11 DIAGNOSIS — Z992 Dependence on renal dialysis: Secondary | ICD-10-CM | POA: Diagnosis not present

## 2024-08-13 DIAGNOSIS — Z992 Dependence on renal dialysis: Secondary | ICD-10-CM | POA: Diagnosis not present

## 2024-08-13 DIAGNOSIS — N186 End stage renal disease: Secondary | ICD-10-CM | POA: Diagnosis not present

## 2024-08-13 DIAGNOSIS — N2581 Secondary hyperparathyroidism of renal origin: Secondary | ICD-10-CM | POA: Diagnosis not present

## 2024-08-16 DIAGNOSIS — N2581 Secondary hyperparathyroidism of renal origin: Secondary | ICD-10-CM | POA: Diagnosis not present

## 2024-08-16 DIAGNOSIS — Z992 Dependence on renal dialysis: Secondary | ICD-10-CM | POA: Diagnosis not present

## 2024-08-16 DIAGNOSIS — N186 End stage renal disease: Secondary | ICD-10-CM | POA: Diagnosis not present

## 2024-08-20 DIAGNOSIS — Z6823 Body mass index (BMI) 23.0-23.9, adult: Secondary | ICD-10-CM | POA: Diagnosis not present

## 2024-08-20 DIAGNOSIS — N186 End stage renal disease: Secondary | ICD-10-CM | POA: Diagnosis not present

## 2024-08-20 DIAGNOSIS — Z992 Dependence on renal dialysis: Secondary | ICD-10-CM | POA: Diagnosis not present

## 2024-08-20 DIAGNOSIS — N2581 Secondary hyperparathyroidism of renal origin: Secondary | ICD-10-CM | POA: Diagnosis not present

## 2024-08-23 DIAGNOSIS — Z992 Dependence on renal dialysis: Secondary | ICD-10-CM | POA: Diagnosis not present

## 2024-08-23 DIAGNOSIS — M62830 Muscle spasm of back: Secondary | ICD-10-CM | POA: Diagnosis not present

## 2024-08-23 DIAGNOSIS — E213 Hyperparathyroidism, unspecified: Secondary | ICD-10-CM | POA: Diagnosis not present

## 2024-08-23 DIAGNOSIS — I10 Essential (primary) hypertension: Secondary | ICD-10-CM | POA: Diagnosis not present

## 2024-08-23 DIAGNOSIS — K219 Gastro-esophageal reflux disease without esophagitis: Secondary | ICD-10-CM | POA: Diagnosis not present

## 2024-08-23 DIAGNOSIS — N186 End stage renal disease: Secondary | ICD-10-CM | POA: Diagnosis not present

## 2024-08-23 DIAGNOSIS — Z6823 Body mass index (BMI) 23.0-23.9, adult: Secondary | ICD-10-CM | POA: Diagnosis not present

## 2024-08-23 DIAGNOSIS — N2581 Secondary hyperparathyroidism of renal origin: Secondary | ICD-10-CM | POA: Diagnosis not present

## 2024-08-25 DIAGNOSIS — Z992 Dependence on renal dialysis: Secondary | ICD-10-CM | POA: Diagnosis not present

## 2024-08-25 DIAGNOSIS — N186 End stage renal disease: Secondary | ICD-10-CM | POA: Diagnosis not present

## 2024-08-25 DIAGNOSIS — N2581 Secondary hyperparathyroidism of renal origin: Secondary | ICD-10-CM | POA: Diagnosis not present

## 2024-08-27 DIAGNOSIS — M62830 Muscle spasm of back: Secondary | ICD-10-CM | POA: Diagnosis not present

## 2024-08-27 DIAGNOSIS — N2581 Secondary hyperparathyroidism of renal origin: Secondary | ICD-10-CM | POA: Diagnosis not present

## 2024-08-27 DIAGNOSIS — E213 Hyperparathyroidism, unspecified: Secondary | ICD-10-CM | POA: Diagnosis not present

## 2024-08-27 DIAGNOSIS — Z992 Dependence on renal dialysis: Secondary | ICD-10-CM | POA: Diagnosis not present

## 2024-08-27 DIAGNOSIS — N186 End stage renal disease: Secondary | ICD-10-CM | POA: Diagnosis not present

## 2024-08-27 DIAGNOSIS — I10 Essential (primary) hypertension: Secondary | ICD-10-CM | POA: Diagnosis not present

## 2024-08-27 DIAGNOSIS — K219 Gastro-esophageal reflux disease without esophagitis: Secondary | ICD-10-CM | POA: Diagnosis not present

## 2024-08-30 DIAGNOSIS — Z992 Dependence on renal dialysis: Secondary | ICD-10-CM | POA: Diagnosis not present

## 2024-08-30 DIAGNOSIS — N186 End stage renal disease: Secondary | ICD-10-CM | POA: Diagnosis not present

## 2024-08-30 DIAGNOSIS — N2581 Secondary hyperparathyroidism of renal origin: Secondary | ICD-10-CM | POA: Diagnosis not present

## 2024-09-01 DIAGNOSIS — N2581 Secondary hyperparathyroidism of renal origin: Secondary | ICD-10-CM | POA: Diagnosis not present

## 2024-09-01 DIAGNOSIS — Z992 Dependence on renal dialysis: Secondary | ICD-10-CM | POA: Diagnosis not present

## 2024-09-01 DIAGNOSIS — N186 End stage renal disease: Secondary | ICD-10-CM | POA: Diagnosis not present

## 2024-09-03 DIAGNOSIS — Z992 Dependence on renal dialysis: Secondary | ICD-10-CM | POA: Diagnosis not present

## 2024-09-03 DIAGNOSIS — N186 End stage renal disease: Secondary | ICD-10-CM | POA: Diagnosis not present

## 2024-09-03 DIAGNOSIS — N2581 Secondary hyperparathyroidism of renal origin: Secondary | ICD-10-CM | POA: Diagnosis not present

## 2024-09-07 DIAGNOSIS — Z992 Dependence on renal dialysis: Secondary | ICD-10-CM | POA: Diagnosis not present

## 2024-09-07 DIAGNOSIS — N186 End stage renal disease: Secondary | ICD-10-CM | POA: Diagnosis not present

## 2024-10-21 ENCOUNTER — Telehealth: Payer: Self-pay | Admitting: *Deleted

## 2024-10-21 NOTE — Transitions of Care (Post Inpatient/ED Visit) (Signed)
   10/21/2024  Name: Isaiah Ramos MRN: [id number] DOB: February 14, 1976  Today's TOC FU Call Status: Today's TOC FU Call Status:: Successful TOC FU Call Completed TOC FU Call Complete Date: 10/21/24  Patient's Name and Date of Birth confirmed. Name, DOB  Transition Care Management Follow-up Telephone Call Date of Discharge: 10/20/24 Discharge Facility: Other [facility]) Name of Other (Non-[facility]) Discharge Facility: [facility] Type of Discharge: Inpatient Admission Primary Inpatient Discharge Diagnosis:: ESRD, Kidney transplant How have you been since you were released from the hospital?: Same Any questions or concerns?: No  Items Reviewed: Did you receive and understand the discharge instructions provided?: Yes Medications obtained,verified, and reconciled?: No Medications Not Reviewed Reasons:: Other: (Patient ended call prior to Medication Review) Dietary orders reviewed?: No Do you have support at home?: Yes People in Home [RPT]: parent(s) Name of Support/Comfort Primary Source: Mother/Sabrina  Medications Reviewed Today:Unable to complete Medications Reviewed Today   Medications were not reviewed in this encounter     Home Care and Equipment/Supplies: Were Home Health Services Ordered?: No Any new equipment or medical supplies ordered?: No  Functional Questionnaire: Do you need assistance with bathing/showering or dressing?: No Do you need assistance with meal preparation?: No Do you need assistance with eating?: No Do you have difficulty maintaining continence: No Do you need assistance with getting out of bed/getting out of a chair/moving?: Yes (Mother assists) Do you have difficulty managing or taking your medications?: Yes  Follow up appointments reviewed: PCP Follow-up appointment confirmed?: No (Patient did not want to discuss scheduling a hospital follow up with PCP.) MD Provider Line Number:[phone] Given: No Specialist Hospital Follow-up appointment  confirmed?: Yes Date of Specialist follow-up appointment?: 10/22/24 Follow-Up Specialty Provider:: Transplant Clinic Do you need transportation to your follow-up appointment?: No  SDOH Interventions Today    Flowsheet Row Most Recent Value  SDOH Interventions   Transportation Interventions Intervention Not Indicated   RNCM unable to complete TOC Assessment, Medication Review and SDOH Assessment. Patient declined TOC 30-day Program enrollment. Patient stated, This call is getting on my nerves and ended the call.  Andrea Dimes RN, BSN [facility]  [facility] RN Care Manager [phone]
